# Patient Record
Sex: Male | Born: 1945
Health system: Southern US, Community
[De-identification: ages and names within clinical notes are randomized; demographics above are authoritative.]

## PROBLEM LIST (undated history)

## (undated) DIAGNOSIS — Z972 Presence of dental prosthetic device (complete) (partial): Secondary | ICD-10-CM

## (undated) DIAGNOSIS — R0600 Dyspnea, unspecified: Secondary | ICD-10-CM

## (undated) DIAGNOSIS — R0789 Other chest pain: Secondary | ICD-10-CM

## (undated) DIAGNOSIS — Z8619 Personal history of other infectious and parasitic diseases: Secondary | ICD-10-CM

## (undated) DIAGNOSIS — Z889 Allergy status to unspecified drugs, medicaments and biological substances status: Secondary | ICD-10-CM

## (undated) DIAGNOSIS — I2510 Atherosclerotic heart disease of native coronary artery without angina pectoris: Secondary | ICD-10-CM

## (undated) DIAGNOSIS — Z973 Presence of spectacles and contact lenses: Secondary | ICD-10-CM

## (undated) DIAGNOSIS — Z974 Presence of external hearing-aid: Secondary | ICD-10-CM

## (undated) DIAGNOSIS — E785 Hyperlipidemia, unspecified: Secondary | ICD-10-CM

## (undated) DIAGNOSIS — M1990 Unspecified osteoarthritis, unspecified site: Secondary | ICD-10-CM

## (undated) DIAGNOSIS — Z720 Tobacco use: Secondary | ICD-10-CM

## (undated) DIAGNOSIS — C61 Malignant neoplasm of prostate: Secondary | ICD-10-CM

## (undated) DIAGNOSIS — K635 Polyp of colon: Secondary | ICD-10-CM

## (undated) DIAGNOSIS — E119 Type 2 diabetes mellitus without complications: Secondary | ICD-10-CM

## (undated) DIAGNOSIS — I739 Peripheral vascular disease, unspecified: Secondary | ICD-10-CM

## (undated) HISTORY — DX: Personal history of other infectious and parasitic diseases: Z86.19

## (undated) HISTORY — DX: Atherosclerotic heart disease of native coronary artery without angina pectoris: I25.10

## (undated) HISTORY — DX: Tobacco use: Z72.0

## (undated) HISTORY — DX: Polyp of colon: K63.5

## (undated) HISTORY — DX: Unspecified osteoarthritis, unspecified site: M19.90

## (undated) HISTORY — DX: Other chest pain: R07.89

## (undated) HISTORY — DX: Hyperlipidemia, unspecified: E78.5

## (undated) HISTORY — PX: PROSTATE BIOPSY: SHX241

## (undated) HISTORY — DX: Peripheral vascular disease, unspecified: I73.9

## (undated) HISTORY — DX: Allergy status to unspecified drugs, medicaments and biological substances status: Z88.9

---

## 2007-01-04 HISTORY — PX: CARDIAC CATHETERIZATION: SHX172

## 2007-11-02 ENCOUNTER — Ambulatory Visit: Payer: Self-pay | Admitting: Internal Medicine

## 2007-11-10 ENCOUNTER — Ambulatory Visit: Payer: Self-pay | Admitting: Cardiology

## 2007-11-10 ENCOUNTER — Inpatient Hospital Stay (HOSPITAL_COMMUNITY): Admission: EM | Admit: 2007-11-10 | Discharge: 2007-11-13 | Payer: Self-pay | Admitting: Emergency Medicine

## 2007-11-12 ENCOUNTER — Encounter: Admission: RE | Admit: 2007-11-12 | Discharge: 2007-11-13 | Payer: Self-pay | Admitting: Cardiology

## 2007-11-28 ENCOUNTER — Ambulatory Visit: Payer: Self-pay | Admitting: Cardiology

## 2007-12-07 ENCOUNTER — Ambulatory Visit: Payer: Self-pay | Admitting: Cardiology

## 2007-12-07 LAB — CONVERTED CEMR LAB
ALT: 13 units/L (ref 0–53)
AST: 14 units/L (ref 0–37)
Albumin: 4 g/dL (ref 3.5–5.2)
Alkaline Phosphatase: 74 units/L (ref 39–117)
BUN: 16 mg/dL (ref 6–23)
Bilirubin, Direct: 0.1 mg/dL (ref 0.0–0.3)
CO2: 28 meq/L (ref 19–32)
Calcium: 9 mg/dL (ref 8.4–10.5)
Chloride: 106 meq/L (ref 96–112)
Cholesterol: 111 mg/dL (ref 0–200)
Creatinine, Ser: 0.8 mg/dL (ref 0.4–1.5)
GFR calc Af Amer: 126 mL/min
GFR calc non Af Amer: 104 mL/min
Glucose, Bld: 104 mg/dL — ABNORMAL HIGH (ref 70–99)
HDL: 29.5 mg/dL — ABNORMAL LOW (ref >39.0)
LDL Cholesterol: 70 mg/dL (ref 0–99)
Potassium: 4.1 meq/L (ref 3.5–5.1)
Sodium: 139 meq/L (ref 135–145)
Total Bilirubin: 0.9 mg/dL (ref 0.3–1.2)
Total CHOL/HDL Ratio: 3.8
Total Protein: 7.1 g/dL (ref 6.0–8.3)
Triglycerides: 60 mg/dL (ref 0–149)
VLDL: 12 mg/dL (ref 0–40)

## 2008-01-25 ENCOUNTER — Ambulatory Visit: Payer: Self-pay | Admitting: Cardiology

## 2008-02-01 ENCOUNTER — Ambulatory Visit: Payer: Self-pay

## 2008-03-07 ENCOUNTER — Ambulatory Visit: Payer: Self-pay | Admitting: Cardiology

## 2008-05-09 ENCOUNTER — Ambulatory Visit: Payer: Self-pay | Admitting: Family Medicine

## 2008-05-09 DIAGNOSIS — E785 Hyperlipidemia, unspecified: Secondary | ICD-10-CM | POA: Insufficient documentation

## 2008-05-09 DIAGNOSIS — F17200 Nicotine dependence, unspecified, uncomplicated: Secondary | ICD-10-CM | POA: Insufficient documentation

## 2008-05-09 DIAGNOSIS — I7389 Other specified peripheral vascular diseases: Secondary | ICD-10-CM | POA: Insufficient documentation

## 2008-05-09 DIAGNOSIS — I259 Chronic ischemic heart disease, unspecified: Secondary | ICD-10-CM | POA: Insufficient documentation

## 2008-05-09 LAB — CONVERTED CEMR LAB
Bilirubin Urine: NEGATIVE
Blood in Urine, dipstick: NEGATIVE
Glucose, Urine, Semiquant: NEGATIVE
Ketones, urine, test strip: NEGATIVE
Nitrite: NEGATIVE
Specific Gravity, Urine: >=1.03
Urobilinogen, UA: 0.2
WBC Urine, dipstick: NEGATIVE
pH: 5.5

## 2008-05-12 LAB — CONVERTED CEMR LAB
ALT: 21 units/L (ref 0–53)
AST: 24 units/L (ref 0–37)
Albumin: 4.2 g/dL (ref 3.5–5.2)
Alkaline Phosphatase: 74 units/L (ref 39–117)
BUN: 19 mg/dL (ref 6–23)
Basophils Absolute: 0 10*3/uL (ref 0.0–0.1)
Basophils Relative: 0.1 % (ref 0.0–3.0)
Bilirubin, Direct: 0.1 mg/dL (ref 0.0–0.3)
CO2: 29 meq/L (ref 19–32)
Calcium: 9 mg/dL (ref 8.4–10.5)
Chloride: 108 meq/L (ref 96–112)
Cholesterol: 104 mg/dL (ref 0–200)
Creatinine, Ser: 0.9 mg/dL (ref 0.4–1.5)
Eosinophils Absolute: 0.2 10*3/uL (ref 0.0–0.7)
Eosinophils Relative: 2.1 % (ref 0.0–5.0)
GFR calc non Af Amer: 90.89 mL/min (ref >60)
Glucose, Bld: 106 mg/dL — ABNORMAL HIGH (ref 70–99)
HCT: 45.9 % (ref 39.0–52.0)
HDL: 31.6 mg/dL — ABNORMAL LOW (ref >39.00)
Hemoglobin: 15.4 g/dL (ref 13.0–17.0)
LDL Cholesterol: 61 mg/dL (ref 0–99)
Lymphocytes Relative: 13.9 % (ref 12.0–46.0)
Lymphs Abs: 1.3 10*3/uL (ref 0.7–4.0)
MCHC: 33.5 g/dL (ref 30.0–36.0)
MCV: 94.6 fL (ref 78.0–100.0)
Monocytes Absolute: 0.5 10*3/uL (ref 0.1–1.0)
Monocytes Relative: 5.1 % (ref 3.0–12.0)
Neutro Abs: 7.5 10*3/uL (ref 1.4–7.7)
Neutrophils Relative %: 78.8 % — ABNORMAL HIGH (ref 43.0–77.0)
PSA: 0.94 ng/mL (ref 0.10–4.00)
Platelets: 253 10*3/uL (ref 150.0–400.0)
Potassium: 4.6 meq/L (ref 3.5–5.1)
RBC: 4.85 M/uL (ref 4.22–5.81)
RDW: 12.7 % (ref 11.5–14.6)
Sodium: 143 meq/L (ref 135–145)
TSH: 0.65 microintl units/mL (ref 0.35–5.50)
Total Bilirubin: 0.9 mg/dL (ref 0.3–1.2)
Total CHOL/HDL Ratio: 3
Total Protein: 7.4 g/dL (ref 6.0–8.3)
Triglycerides: 55 mg/dL (ref 0.0–149.0)
VLDL: 11 mg/dL (ref 0.0–40.0)
WBC: 9.5 10*3/uL (ref 4.5–10.5)

## 2008-05-13 ENCOUNTER — Telehealth: Payer: Self-pay | Admitting: *Deleted

## 2008-06-27 ENCOUNTER — Ambulatory Visit: Payer: Self-pay | Admitting: Family Medicine

## 2008-06-27 ENCOUNTER — Ambulatory Visit: Payer: Self-pay | Admitting: Gastroenterology

## 2008-06-27 DIAGNOSIS — M159 Polyosteoarthritis, unspecified: Secondary | ICD-10-CM | POA: Insufficient documentation

## 2008-07-11 ENCOUNTER — Ambulatory Visit: Payer: Self-pay | Admitting: Cardiology

## 2008-07-23 ENCOUNTER — Telehealth: Payer: Self-pay | Admitting: Family Medicine

## 2008-07-24 ENCOUNTER — Ambulatory Visit: Payer: Self-pay | Admitting: Cardiology

## 2008-07-25 DIAGNOSIS — R0789 Other chest pain: Secondary | ICD-10-CM | POA: Insufficient documentation

## 2008-07-25 LAB — CONVERTED CEMR LAB
Total CK: 136 units/L (ref 7–232)
Troponin I: 0.01 ng/mL (ref <0.06)

## 2008-08-01 ENCOUNTER — Ambulatory Visit: Payer: Self-pay | Admitting: Cardiology

## 2008-08-01 ENCOUNTER — Ambulatory Visit: Payer: Self-pay | Admitting: Gastroenterology

## 2008-08-01 ENCOUNTER — Encounter (INDEPENDENT_AMBULATORY_CARE_PROVIDER_SITE_OTHER): Payer: Self-pay | Admitting: *Deleted

## 2008-08-13 ENCOUNTER — Telehealth: Payer: Self-pay | Admitting: Cardiology

## 2008-08-28 ENCOUNTER — Ambulatory Visit: Payer: Self-pay

## 2008-08-28 ENCOUNTER — Encounter: Payer: Self-pay | Admitting: Cardiology

## 2008-09-02 ENCOUNTER — Telehealth: Payer: Self-pay | Admitting: Cardiology

## 2008-09-12 ENCOUNTER — Ambulatory Visit: Payer: Self-pay | Admitting: Cardiology

## 2008-09-13 ENCOUNTER — Encounter: Payer: Self-pay | Admitting: Cardiology

## 2008-09-16 ENCOUNTER — Encounter: Payer: Self-pay | Admitting: Cardiology

## 2008-09-16 LAB — CONVERTED CEMR LAB
ALT: 16 units/L (ref 0–53)
AST: 20 units/L (ref 0–37)
Albumin: 4 g/dL (ref 3.5–5.2)
Alkaline Phosphatase: 58 units/L (ref 39–117)
BUN: 18 mg/dL (ref 6–23)
Bilirubin, Direct: 0 mg/dL (ref 0.0–0.3)
CO2: 28 meq/L (ref 19–32)
Calcium: 8.8 mg/dL (ref 8.4–10.5)
Chloride: 110 meq/L (ref 96–112)
Cholesterol: 200 mg/dL (ref 0–200)
Creatinine, Ser: 0.9 mg/dL (ref 0.4–1.5)
GFR calc non Af Amer: 90.79 mL/min (ref >60)
Glucose, Bld: 106 mg/dL — ABNORMAL HIGH (ref 70–99)
HDL: 33.2 mg/dL — ABNORMAL LOW (ref >39.00)
LDL Cholesterol: 149 mg/dL — ABNORMAL HIGH (ref 0–99)
Potassium: 4.3 meq/L (ref 3.5–5.1)
Sodium: 142 meq/L (ref 135–145)
Total Bilirubin: 0.9 mg/dL (ref 0.3–1.2)
Total CHOL/HDL Ratio: 6
Total CK: 152 units/L (ref 7–232)
Total Protein: 7.1 g/dL (ref 6.0–8.3)
Triglycerides: 87 mg/dL (ref 0.0–149.0)
VLDL: 17.4 mg/dL (ref 0.0–40.0)

## 2008-09-25 ENCOUNTER — Telehealth: Payer: Self-pay | Admitting: Cardiology

## 2008-11-07 ENCOUNTER — Encounter: Payer: Self-pay | Admitting: Cardiology

## 2008-11-07 ENCOUNTER — Ambulatory Visit: Payer: Self-pay | Admitting: Cardiology

## 2008-11-12 ENCOUNTER — Telehealth: Payer: Self-pay | Admitting: Cardiology

## 2008-11-13 ENCOUNTER — Telehealth: Payer: Self-pay | Admitting: Cardiology

## 2008-11-18 ENCOUNTER — Telehealth: Payer: Self-pay | Admitting: Cardiology

## 2008-11-19 ENCOUNTER — Telehealth: Payer: Self-pay | Admitting: Cardiology

## 2008-12-01 ENCOUNTER — Telehealth: Payer: Self-pay | Admitting: Cardiology

## 2008-12-05 ENCOUNTER — Encounter (INDEPENDENT_AMBULATORY_CARE_PROVIDER_SITE_OTHER): Payer: Self-pay | Admitting: *Deleted

## 2008-12-09 ENCOUNTER — Encounter (INDEPENDENT_AMBULATORY_CARE_PROVIDER_SITE_OTHER): Payer: Self-pay | Admitting: *Deleted

## 2008-12-10 ENCOUNTER — Ambulatory Visit: Payer: Self-pay | Admitting: Gastroenterology

## 2008-12-17 ENCOUNTER — Ambulatory Visit: Payer: Self-pay | Admitting: Gastroenterology

## 2008-12-19 ENCOUNTER — Encounter: Payer: Self-pay | Admitting: Gastroenterology

## 2009-01-03 HISTORY — PX: BACK SURGERY: SHX140

## 2009-01-30 ENCOUNTER — Encounter: Payer: Self-pay | Admitting: Cardiology

## 2009-01-30 ENCOUNTER — Ambulatory Visit: Payer: Self-pay | Admitting: Cardiology

## 2009-03-06 ENCOUNTER — Ambulatory Visit: Payer: Self-pay | Admitting: Cardiology

## 2009-03-26 ENCOUNTER — Encounter: Payer: Self-pay | Admitting: Cardiology

## 2009-03-26 ENCOUNTER — Telehealth (INDEPENDENT_AMBULATORY_CARE_PROVIDER_SITE_OTHER): Payer: Self-pay | Admitting: *Deleted

## 2009-03-30 ENCOUNTER — Encounter: Payer: Self-pay | Admitting: Cardiology

## 2009-03-31 ENCOUNTER — Encounter (INDEPENDENT_AMBULATORY_CARE_PROVIDER_SITE_OTHER): Payer: Self-pay | Admitting: *Deleted

## 2009-04-08 ENCOUNTER — Telehealth (INDEPENDENT_AMBULATORY_CARE_PROVIDER_SITE_OTHER): Payer: Self-pay | Admitting: *Deleted

## 2009-04-15 ENCOUNTER — Telehealth (INDEPENDENT_AMBULATORY_CARE_PROVIDER_SITE_OTHER): Payer: Self-pay | Admitting: *Deleted

## 2009-05-13 ENCOUNTER — Telehealth: Payer: Self-pay | Admitting: Cardiology

## 2009-05-13 ENCOUNTER — Encounter: Payer: Self-pay | Admitting: Cardiology

## 2009-06-02 ENCOUNTER — Inpatient Hospital Stay (HOSPITAL_COMMUNITY): Admission: RE | Admit: 2009-06-02 | Discharge: 2009-06-07 | Payer: Self-pay | Admitting: Neurosurgery

## 2009-09-11 ENCOUNTER — Ambulatory Visit: Payer: Self-pay | Admitting: Cardiology

## 2009-09-11 DIAGNOSIS — E663 Overweight: Secondary | ICD-10-CM | POA: Insufficient documentation

## 2009-12-03 ENCOUNTER — Encounter: Payer: Self-pay | Admitting: Cardiology

## 2010-02-02 NOTE — Progress Notes (Signed)
Summary: status of lipitor dosage (con't)  Medications Added BISACODYL EC 5 MG TBEC (BISACODYL) Day before procedure take 2 at 3pm and 2 at 8pm. METOCLOPRAMIDE HCL 10 MG  TABS (METOCLOPRAMIDE HCL) As per prep instructions. MIRALAX   POWD (POLYETHYLENE GLYCOL 3350) As per prep  instructions. METOPROLOL TARTRATE 25 MG TABS (METOPROLOL TARTRATE) take one tab two times a day PLAVIX 75 MG TABS (CLOPIDOGREL BISULFATE) once daily LIPITOR 40 MG TABS (ATORVASTATIN CALCIUM) once daily LIPITOR 40 MG TABS (ATORVASTATIN CALCIUM) on hold BAYER ASPIRIN 325 MG TABS (ASPIRIN) once daily * TRACER STUDY DRUG qd CHANTIX STARTING MONTH PAK 0.5 MG X 11 & 1 MG X 42 TABS (VARENICLINE TARTRATE) UAD CHANTIX STARTING MONTH PAK 0.5 MG X 11 & 1 MG X 42 TABS (VARENICLINE TARTRATE) UAD CHANTIX CONTINUING MONTH PAK 1 MG TABS (VARENICLINE TARTRATE) UAD MOVIPREP 100 GM  SOLR (PEG-KCL-NACL-NASULF-NA ASC-C) As per prep instructions. LIPITOR 10 MG TABS (ATORVASTATIN CALCIUM) Take one tablet by mouth daily. LIPITOR 10 MG TABS (ATORVASTATIN CALCIUM) Take one tablet by mouth daily. LIPITOR 10 MG TABS (ATORVASTATIN CALCIUM) Take one tablet by mouth daily.       Phone Note Outgoing Call   Call placed by: Minerva Areola, RN, BSN,  November 19, 2008 3:44 PM Call placed to: Patient Details for Reason: Returning call from pt from 11/18/08. Summary of Call: After reviewing pt's office notes & labs, spoke with Dr. Excell Seltzer (DOD) and he said for pt to stay on  the Lipitor 10mg  and if Dr. Daleen Squibb wanted to make any changes when he returned he could do it then. Initial call taken by: Minerva Areola, RN, BSN,  November 19, 2008 3:52 PM  Follow-up for Phone Call        Called and spoke with pt's wife.  Told her Dr. Earmon Phoenix recommendation.  However, she said that the pt ran out of the 10mg  tablets and has been taking 40mg  since Saturday 11/13 that he has a 3 month supply of.  (Note: he had been on 40mg  initially until the question of  the lipitor causing statin induced myalgias and myositis).  He was given an epidural injection (lumber 4 & 5) on 11/15 with Dr. Alvester Morin to help with the pain.   I told her that I think it would be better if he took the 10mg  of Lipitor because if the 40mg  was causing his problems I didn't want him to take the increased dose now and have it aggrevate the issue even though he recently had that injection.  [facility] [place] [place]. I also told her that Dr. Daleen Squibb will be in the office on Wednesday, 11/24, and can review what has been done; if he wants to make any changes the office will be in touch with them. Pt's wife verbalized understanding of everything. Minerva Areola, RN, BSN  November 19, 2008 4:04 PM  Follow-up by: Minerva Areola, RN, BSN,  November 19, 2008 4:04 PM    New/Updated Medications: LIPITOR 10 MG TABS (ATORVASTATIN CALCIUM) Take one tablet by mouth daily. Prescriptions: LIPITOR 10 MG TABS (ATORVASTATIN CALCIUM) Take one tablet by mouth daily.  #30 x 6   Entered by:   Minerva Areola, RN, BSN   Authorized by:   Norva Karvonen, MD   Signed by:   Minerva Areola, RN, BSN on 11/19/2008   Method used:   [facility]  [address] [id number]       [address]       [place], [place]  [address]       Ph: [phone] or [phone]       Fax: [phone]   RxID:   [id number]

## 2010-02-02 NOTE — Letter (Signed)
Summary: [facility]  [facility]   Imported By: Kassie Mends 12/30/2008 10:25:43  _____________________________________________________________________  External Attachment:    Type:   Image     Comment:   External Document

## 2010-02-02 NOTE — Assessment & Plan Note (Signed)
History of Present Illness Visit Type: Initial Visit Primary GI MD: Rob Bunting MD Primary Provider: Kelle Darting, MD Chief Complaint: screening Colonoscopy  on Plavix History of Present Illness:      very pleasant 65 year old man who had colonoscopy [place], couple polyps removed, 10 years ago.  No bowel troubles (no overt bleeding, constipation, diarrhea).  No colon cancer in family.  AMI November 2009, a drug eluting stent was placed in LAD.  Has been on plavix since then.              Current Medications (verified): 1)  Metoprolol Tartrate 25 Mg Tabs (Metoprolol Tartrate) .... Take One Tab Two Times A Day 2)  Plavix 75 Mg Tabs (Clopidogrel Bisulfate) .... Once Daily 3)  Lipitor 40 Mg Tabs (Atorvastatin Calcium) .... On Hold 4)  Bayer Aspirin 325 Mg Tabs (Aspirin) .... Once Daily 5)  Tracer Study Drug .... Qd 6)  Chantix Continuing Month Pak 1 Mg Tabs (Varenicline Tartrate) .... Uad 7)  Lipitor 10 Mg Tabs (Atorvastatin Calcium) .... Take One Tablet By Mouth Daily.  Allergies (verified): No Known Drug Allergies  Past History:  Past Medical History: CHEST DISCOMFORT (ICD-786.59) CORONARY ARTERY DISEASE, S/P PTCA , drug-eluting stent placed in LAD November 2009 HYPERLIPIDEMIA (ICD-272.4) PVD WITH CLAUDICATION (ICD-443.89) GEN OSTEOARTHROSIS INVOLVING MULTIPLE SITES (ICD-715.09) TOBACCO ABUSE (ICD-305.1) arthritis chicken pox allergies polyps in colon  Past Surgical History: heart catherization 2 stents, drug-eluting in the LAD, 2009  Family History: Father: deceased - [place] Mother:  Siblings: 1 brother - healthy               1 sister - Visual merchandiser  no colon cancer  Social History: Occupation:[employer] Married Alcohol use-no Drug use-no Former Smoker  quit 3/ 2010    Review of Systems       Pertinent positive and negative review of systems were noted in the above HPI and GI specific review of systems.  All other review of systems was  otherwise negative.   Vital Signs:  Patient profile:   65 year old male Height:      72 inches Weight:      185.13 pounds BMI:     25.20 Pulse rate:   68 / minute Pulse rhythm:   regular BP sitting:   116 / 76  (left arm)  Vitals Entered By: Milford Cage NCMA (August 01, 2008 10:23 AM)  Physical Exam  Additional Exam:  Constitutional: generally well appearing Psychiatric: alert and oriented times 3 Eyes: extraocular movements intact Mouth: oropharynx moist, no lesions Neck: supple, no lymphadenopathy Cardiovascular: heart regular rate and rythm Lungs: CTA bilaterally Abdomen: soft, non-tender, non-distended, no obvious ascites, no peritoneal signs, normal bowel sounds Extremities: no lower extremity edema bilaterally Skin: no lesions on visible extremities    Impression & Recommendations:  Problem # 1:  History of colon polyps it is not clear what the pathology was from these polyps that were removed 10-15 years ago at an outside institution. Even if they were hyperplastic, non-precancerous he would be due for repeat colonoscopy at this long other interval now anyway. We will arrange for him to have a colonoscopy however he is currently on Plavix for fairly recently placed drug-eluting stent in his LAD. Cardiologists are usually very reluctant to have Plavix held for least one year following the placement of such a drug-eluting stent. That would put this colonoscopy out to about December of this year. We will discuss this with Dr. wall his  cardiologist and hopefully at that one year interval he will allow [place] to hold his Plavix for 5 days prior to a colonoscopy.  Patient Instructions: 1)  We will contact Dr. Vern Claude office about holding plavix for 5 days prior to colonoscopy in December 2010 (this will be one year after the stent was placed). 2)  You will be scheduled to have a colonoscopy (in December 2010). 3)  A copy of this information will be sent to Dr. Tawanna Cooler, Dr. Daleen Squibb. 4)   The medication list was reviewed and reconciled.  All changed / newly prescribed medications were explained.  A complete medication list was provided to the patient / caregiver.

## 2010-02-02 NOTE — Assessment & Plan Note (Signed)
Summary: per check out/sf  Medications Added LIPITOR 40 MG TABS (ATORVASTATIN CALCIUM) on hold LIPITOR 10 MG TABS (ATORVASTATIN CALCIUM) Take one tablet by mouth daily.      Allergies Added: NKDA  Visit Type:  rov Primary Provider:  Roderick Pee MD  CC:  bilateral hip and pt is off lipitor x 1 week..no other complaints today.  History of Present Illness: Adam Gilbert comes in today because of statin induced myalgias and myositis. All Lipitor 40 mg per day which he tolerated for some time he began to develop bilateral hip and lateral leg pain. CPK enzyme was elevated. We discontinued his CPK enzymes decreased.  As I told today we will try a lower dose of Lipitor, 10 mg per day to see if he can tolerate this. I really would like him to be on some statin as much for plaque stabilization and reduction in vascular events than anything else. He and his wife agree with this plan.  Current Medications (verified): 1)  Metoprolol Tartrate 25 Mg Tabs (Metoprolol Tartrate) .... Take One Tab Two Times A Day 2)  Plavix 75 Mg Tabs (Clopidogrel Bisulfate) .... Once Daily 3)  Lipitor 40 Mg Tabs (Atorvastatin Calcium) .... On Hold 4)  Bayer Aspirin 325 Mg Tabs (Aspirin) .... Once Daily 5)  Tracer Study Drug .... Qd 6)  Chantix Continuing Month Pak 1 Mg Tabs (Varenicline Tartrate) .... Uad  Allergies (verified): No Known Drug Allergies  Past History:  Past Medical History: Last updated: 07/25/2008 CHEST DISCOMFORT (ICD-786.59) CORONARY ARTERY DISEASE, S/P PTCA (ICD-414.9) HYPERLIPIDEMIA (ICD-272.4) PVD WITH CLAUDICATION (ICD-443.89) GEN OSTEOARTHROSIS INVOLVING MULTIPLE SITES (ICD-715.09) TOBACCO ABUSE (ICD-305.1) arthritis chicken pox allergies polyps in colon  Past Surgical History: Last updated: 07/25/2008 heart catherization 2 stints  Family History: Last updated: May 30, 2008 Father: deceased - [place] Mother:  Siblings: 1 brother - healthy               1 sister - Visual merchandiser   Social History: Last updated: 06/27/2008 Occupation:[employer] Married Alcohol use-no Drug use-no Former Smoker  quit 3/ 2010  Risk Factors: Smoking Status: quit (06/27/2008)  Review of Systems       negative other than history of present illness  Vital Signs:  Patient profile:   65 year old male Height:      72 inches Weight:      185 pounds BMI:     25.18 Pulse rate:   68 / minute Pulse rhythm:   regular BP sitting:   128 / 84  (left arm) Cuff size:   large  Vitals Entered By: Danielle Rankin, CMA (August 01, 2008 9:24 AM)  Physical Exam  General:  Well developed, well nourished, in no acute distress. Msk:  Back normal, normal gait. Muscle strength and tone normal.   Impression & Recommendations:  Problem # 1:  HYPERLIPIDEMIA (ICD-272.4) Assessment Deteriorated As mentioned in the history of present illness, we'll try to milligrams of Lipitor to see if he can tolate this. Followup blood work in 6 weeks including a CPK. His updated medication list for this problem includes:    Lipitor 40 Mg Tabs (Atorvastatin calcium) ..... On hold    Lipitor 10 Mg Tabs (Atorvastatin calcium) .Marland Kitchen... Take one tablet by mouth daily.  His updated medication list for this problem includes:    Lipitor 40 Mg Tabs (Atorvastatin calcium) ..... On hold  Patient Instructions: 1)  Your physician recommends that you schedule a follow-up appointment in: 6 weeks 2)  Your  physician recommends that you return for lab work in: please do labs in 6 weeks with f/u appoint Prescriptions: LIPITOR 10 MG TABS (ATORVASTATIN CALCIUM) Take one tablet by mouth daily.  #30 x 6   Entered by:   Ledon Snare, RN   Authorized by:   Gaylord Shih, MD, [facility]   Signed by:   Ledon Snare, RN on 08/01/2008   Method used:   [facility]  [address] [phone][facility]       [address]       [place], [place]  [address]       Ph: [phone] or [phone]       Fax: [phone]    RxID:   [id number]

## 2010-02-02 NOTE — Progress Notes (Signed)
Summary: questions about meds/possible refill   Phone Note Call from Patient Call back at Work Phone [phone]   Caller: Spouse Gaylyn Lambert Reason for Call: Talk to Nurse Summary of Call: Several questions concerning LIpitor.  Should he continue to take as prescribed or has dosage changed?  Initial call taken by: Burnard Leigh,  November 13, 2008 1:22 PM  Follow-up for Phone Call        talked with wife --asking if Dr Daleen Squibb has reviewed the recent labs done by research--talked with Julie--these have not been reviewed by Dr Daleen Squibb yet--will scan into EMR and forward to Dr Daleen Squibb for review--wife asking about Lipitor dose

## 2010-02-02 NOTE — Assessment & Plan Note (Signed)
Summary: to be est/pt will come in fasting/njr   Vital Signs:  Patient profile:   65 year old male Height:      72 inches Weight:      179 pounds BMI:     24.36 Temp:     98.5 degrees F oral BP sitting:   110 / 80  (left arm) Cuff size:   regular  Vitals Entered By: Kern Reap CMA (May 09, 2008 9:51 AM)  Reason for Visit new to establish  History of Present Illness: Adam Gilbert is a 66 year old, married male, who comes in today as a new patient for evaluation of multiple medical problems.  He has underlying coronary disease and had his last catheterization and stent in November 2009.  This was a second stent.  He continues to smoke a half a pack a cigarettes a day.  He's done the smoking cessation program 3 times however, he continues to go back and smoke.  I've outlined program for him and will follow him meticulously.  He must stop smoking.  He takes metacarpal 25 mg b.i.d., Plavix, 75 mg daily, Lipitor, 40 mg nightly, one aspirin tablet daily.  Review of systems he gets routine eye care.  Dental care.  Colonoscopy by Dr. Charm Barges in [place] 2003 was normal except for some polyps.  He is due for Aredia.  His previous medical doctor was Dr. Lodema Hong in [place].  He works at [employer].  Vaccinations unknown.  Will update all vaccinations.  Preventive Screening-Counseling & Management     Smoking Status: current      Drug Use:  no.    Allergies (verified): No Known Drug Allergies  Past History:  Past medical, surgical, family and social histories (including risk factors) reviewed, and no changes noted (except as noted below).  Past Medical History:    heart disease    arthritis    chicken pox    allergies    arrhythmia    Hyperlipidemia    polyps in colon  Past Surgical History:    heart catherization    2 stints  Family History:    Reviewed history and no changes required:       Father: deceased - [place]       Mother:        Siblings: 1 brother - healthy                      1 sister - Visual merchandiser  Social History:    Reviewed history and no changes required:       Occupation:[employer]       Married       Current Smoker       Alcohol use-no       Drug use-no    Smoking Status:  current    Drug Use:  no  Review of Systems      See HPI  Physical Exam  General:  Well-developed,well-nourished,in no acute distress; alert,appropriate and cooperative throughout examination Head:  Normocephalic and atraumatic without obvious abnormalities. No apparent alopecia or balding. Eyes:  No corneal or conjunctival inflammation noted. EOMI. Perrla. Funduscopic exam benign, without hemorrhages, exudates or papilledema. Vision grossly normal. Ears:  External ear exam shows no significant lesions or deformities.  Otoscopic examination reveals clear canals, tympanic membranes are intact bilaterally without bulging, retraction, inflammation or discharge. Hearing is grossly normal bilaterally. Nose:  External nasal examination shows no deformity or inflammation. Nasal mucosa are pink  and moist without lesions or exudates. Mouth:  Oral mucosa and oropharynx without lesions or exudates.  Teeth in good repair. Neck:  No deformities, masses, or tenderness noted. Chest Wall:  No deformities, masses, tenderness or gynecomastia noted. Breasts:  No masses or gynecomastia noted Lungs:  breast sounds distant, but symmetrical Heart:  Normal rate and regular rhythm. S1 and S2 normal without gallop, murmur, click, rub or other extra sounds. Abdomen:  Bowel sounds positive,abdomen soft and non-tender without masses, organomegaly or hernias noted. Rectal:  No external abnormalities noted. Normal sphincter tone. No rectal masses or tenderness. Genitalia:  circumcise male left testicle normal.  Right testicle 4 times because of hydrocele Prostate:  Prostate gland firm and smooth, no enlargement, nodularity, tenderness, mass, asymmetry or induration. Msk:  No  deformity or scoliosis noted of thoracic or lumbar spine.   Pulses:  peripheral pulses markedly diminished right femoral 1+ left femoral 2+ peripheral pulses.  All 2+ patient admits to claudication Extremities:  No clubbing, cyanosis, edema, or deformity noted with normal full range of motion of all joints.   Neurologic:  No cranial nerve deficits noted. Station and gait are normal. Plantar reflexes are down-going bilaterally. DTRs are symmetrical throughout. Sensory, motor and coordinative functions appear intact. Skin:  Intact without suspicious lesions or rashes Cervical Nodes:  No lymphadenopathy noted Axillary Nodes:  No palpable lymphadenopathy Inguinal Nodes:  No significant adenopathy Psych:  Cognition and judgment appear intact. Alert and cooperative with normal attention span and concentration. No apparent delusions, illusions, hallucinations   Impression & Recommendations:  Problem # 1:  HYPERLIPIDEMIA (ICD-272.4) Assessment Improved  His updated medication list for this problem includes:    Lipitor 40 Mg Tabs (Atorvastatin calcium) ..... Once daily  Orders: Prescription Created Electronically [phone]) Tobacco use cessation intermediate 3-10 minutes (99406) Venipuncture (96295) UA Dipstick w/o Micro (automated)  (81003) TLB-Lipid Panel (80061-LIPID) TLB-BMP (Basic Metabolic Panel-BMET) (80048-METABOL) TLB-CBC Platelet - w/Differential (85025-CBCD) TLB-Hepatic/Liver Function Pnl (80076-HEPATIC) TLB-TSH (Thyroid Stimulating Hormone) (84443-TSH) TLB-PSA (Prostate Specific Antigen) (84153-PSA)  Problem # 2:  PVD WITH CLAUDICATION (ICD-443.89) Assessment: New  Orders: Prescription Created Electronically [phone]) Tobacco use cessation intermediate 3-10 minutes (99406) Venipuncture (24401) UA Dipstick w/o Micro (automated)  (81003) TLB-Lipid Panel (80061-LIPID) TLB-BMP (Basic Metabolic Panel-BMET) (80048-METABOL) TLB-CBC Platelet - w/Differential (85025-CBCD)  TLB-Hepatic/Liver Function Pnl (80076-HEPATIC) TLB-TSH (Thyroid Stimulating Hormone) (84443-TSH) TLB-PSA (Prostate Specific Antigen) (84153-PSA)  Problem # 3:  CORONARY ARTERY DISEASE, S/P PTCA (ICD-414.9) Assessment: Unchanged  His updated medication list for this problem includes:    Metoprolol Tartrate 25 Mg Tabs (Metoprolol tartrate) .Marland Kitchen... Take one tab two times a day    Plavix 75 Mg Tabs (Clopidogrel bisulfate) ..... Once daily    Bayer Aspirin 325 Mg Tabs (Aspirin) ..... Once daily  Orders: Prescription Created Electronically [phone]) Tobacco use cessation intermediate 3-10 minutes (99406) Venipuncture (36644) UA Dipstick w/o Micro (automated)  (81003) TLB-Lipid Panel (80061-LIPID) TLB-BMP (Basic Metabolic Panel-BMET) (80048-METABOL) TLB-CBC Platelet - w/Differential (85025-CBCD) TLB-Hepatic/Liver Function Pnl (80076-HEPATIC) TLB-TSH (Thyroid Stimulating Hormone) (84443-TSH) TLB-PSA (Prostate Specific Antigen) (84153-PSA)  Problem # 4:  TOBACCO ABUSE (ICD-305.1) Assessment: Unchanged  Orders: Prescription Created Electronically [phone]) Tobacco use cessation intermediate 3-10 minutes (99406) Venipuncture (25956) UA Dipstick w/o Micro (automated)  (81003) TLB-Lipid Panel (80061-LIPID) TLB-BMP (Basic Metabolic Panel-BMET) (80048-METABOL) TLB-CBC Platelet - w/Differential (85025-CBCD) TLB-Hepatic/Liver Function Pnl (80076-HEPATIC) TLB-TSH (Thyroid Stimulating Hormone) (84443-TSH) TLB-PSA (Prostate Specific Antigen) (84153-PSA)  His updated medication list for this problem includes:    Chantix Starting Month Pak 0.5 Mg X 11 &  1 Mg X 42 Tabs (Varenicline tartrate) ..... Uad    Chantix Continuing Month Pak 1 Mg Tabs (Varenicline tartrate) ..... Uad  Complete Medication List: 1)  Metoprolol Tartrate 25 Mg Tabs (Metoprolol tartrate) .... Take one tab two times a day 2)  Plavix 75 Mg Tabs (Clopidogrel bisulfate) .... Once daily 3)  Lipitor 40 Mg Tabs (Atorvastatin calcium)  .... Once daily 4)  Bayer Aspirin 325 Mg Tabs (Aspirin) .... Once daily 5)  Tracer Study Drug  .... Qd 6)  Chantix Starting Month Pak 0.5 Mg X 11 & 1 Mg X 42 Tabs (Varenicline tartrate) .... Uad 7)  Chantix Continuing Month Pak 1 Mg Tabs (Varenicline tartrate) .... Uad  Other Orders: Gastroenterology Referral (GI)  Patient Instructions: 1)  begin the smoking cessation program as outlined return to see me in two months for follow-up. 2)  If you began having more discomfort in your legs with walking, but me know immediately. 3)  Schedule a colonoscopy/sigmoidoscopy to help detect colon cancer. 4)  Take an Aspirin every day. Prescriptions: LIPITOR 40 MG TABS (ATORVASTATIN CALCIUM) once daily  #100 x 3   Entered and Authorized by:   Roderick Pee MD   Signed by:   Roderick Pee MD on 05/09/2008   Method used:   [facility]  [address] [phone][facility]ail[facility]       [address]       [place], [place]  [address]       Ph: [phone] or [phone]       Fax: [phone]   RxID:   [id number] PLAVIX 75 MG TABS (CLOPIDOGREL BISULFATE) once daily  #100 x 3   Entered and Authorized by:   Roderick Pee MD   Signed by:   Roderick Pee MD on 05/09/2008   Method used:   [facility]        [facility]  [address] [phone]* (retail)       [address]       [place], [place]  [address]       Ph: [phone] or [phone]       Fax: [phone]   RxID:   [id number] METOPROLOL TARTRATE 25 MG TABS (METOPROLOL TARTRATE) take one tab two times a day  #200 x 3   Entered and Authorized by:   Roderick Pee MD   Signed by:   Roderick Pee MD on 05/09/2008   Method used:   [facility]        [facility]  [address] [phone]* (retail)       [address]       [place], [place]  [address]       Ph: [phone] or [phone]       Fax: [phone]   RxID:   [id number] CHANTIX CONTINUING MONTH PAK 1 MG TABS (VARENICLINE TARTRATE)  UAD  #1 x 3   Entered and Authorized by:   Roderick Pee MD   Signed by:   Roderick Pee MD on 05/09/2008   Method used:   [facility]        [facility]  [address] [phone]* (retail)       [address] [facility]  [place], [place]  [address]       Ph: [phone] or [phone]       Fax: [phone]   RxID:   [id number] CHANTIX STARTING MONTH PAK 0.5 MG X 11 & 1 MG X 42 TABS (VARENICLINE TARTRATE) UAD  #1 x 0   Entered and Authorized by:   Roderick Pee MD   Signed by:   Roderick Pee MD on 05/09/2008   Method used:   [facility]        [facility]  [address] [phone]* (retail)       [address]       [place], [place]  [address]       Ph: [phone] or [phone]       Fax: [phone]   RxID:   [id number]    Preventive Care Screening  Colonoscopy:    Date:  01/04/2000    Next Due:  01/2010    Results:  Hyperplastic Polyp    Laboratory Results   Urine Tests    Routine Urinalysis   Color: straw Appearance: Cloudy Glucose: negative   (Normal Range: Negative) Bilirubin: negative   (Normal Range: Negative) Ketone: negative   (Normal Range: Negative) Spec. Gravity: >=1.030   (Normal Range: 1.003-1.035) Blood: negative   (Normal Range: Negative) pH: 5.5   (Normal Range: 5.0-8.0) Protein: trace   (Normal Range: Negative) Urobilinogen: 0.2   (Normal Range: 0-1) Nitrite: negative   (Normal Range: Negative) Leukocyte Esterace: negative   (Normal Range: Negative)    Comments: Joanne Chars CMA  May 09, 2008 2:09 PM

## 2010-02-02 NOTE — Progress Notes (Signed)
   Phone Note Outgoing Call   Call placed by: Scherrie Bateman, LPN,  April 08, 2009 8:33 AM Call placed to: PT'S WIFE Summary of Call: LMTCB FOR WIFE TO INFORM IF FORMS FILLED OUT APPROPIATELY FOR PLAVIX. Initial call taken by: Scherrie Bateman, LPN,  April 08, 2009 8:34 AM  Follow-up for Phone Call        PER WIFE DID NOT QUALIFY FOR PLAVIX WILL GET THROUGH OTHER INS CO. Follow-up by: Scherrie Bateman, LPN,  April 13, 2009 9:19 AM

## 2010-02-02 NOTE — Progress Notes (Signed)
Summary: Lipitor stopped  Phone Note Call from Patient   Summary of Call: patient's wife is calling because her husband has stopped his lipitor.  He also is having labs tomorrow .  They would like to know if he should start a new med for his lipids.  Corrie Dandy [phone] Initial call taken by: Kern Reap CMA,  July 23, 2008 12:26 PM

## 2010-02-02 NOTE — Letter (Signed)
Summary: [facility]  [facility]   Imported By: Marylou Mccoy 06/17/2009 18:03:41  _____________________________________________________________________  External Attachment:    Type:   Image     Comment:   External Document

## 2010-02-02 NOTE — Procedures (Signed)
Summary: Colonoscopy  Patient: Omarrion Carmer Note: All result statuses are Final unless otherwise noted.  Tests: (1) Colonoscopy (COL)   COL Colonoscopy           DONE     [facility]     [address]     [place], [place]  [address]           COLONOSCOPY PROCEDURE REPORT           PATIENT:  Ephraim, Reichel  MR#:  [id number]     BIRTHDATE:  15-Sep-1945, 63 yrs. old  GENDER:  male           ENDOSCOPIST:  Rachael Fee, MD     Referred by:  Eugenio Hoes Tawanna Cooler, M.D.           PROCEDURE DATE:  12/17/2008     PROCEDURE:  Colonoscopy with biopsy and snare polypectomy     ASA CLASS:  Class II     INDICATIONS:  Routine Risk Screening           MEDICATIONS:   Fentanyl 50 mcg IV, Versed 7 mg IV           DESCRIPTION OF PROCEDURE:   After the risks benefits and     alternatives of the procedure were thoroughly explained, informed     consent was obtained.  Digital rectal exam was performed and     revealed no rectal masses.   The LB [id number] endoscope     was introduced through the anus and advanced to the cecum, which     was identified by both the appendix and ileocecal valve, without     limitations.  The quality of the prep was good, using MoviPrep.     The instrument was then slowly withdrawn as the colon was fully     examined.     <<PROCEDUREIMAGES>>           FINDINGS:  Three small sessile polyps were found, all were removed     and sent to pathology. These ranged in size from 2-34mm, were     removed with cold snare or cold forceps, were locatedin cecum and     rectum. All were sent to pathology (jar 1) (see image3 and     image4).  Mild diverticulosis was found sigmoid to descending     colon segments.  External hemorrhoids were found. These were small     and non-thrombosed.  This was otherwise a normal examination of     the colon (see image2, image5, and image1).   Retroflexed views in     the rectum revealed no abnormalities.    The scope was then  withdrawn from the patient and the procedure completed.           COMPLICATIONS:  None           ENDOSCOPIC IMPRESSION:     1) Three small polyps, all removed and sent to pathology     2) Mild diverticulosis in the sigmoid to descending colon     segments     3) Small external hemorrhoids     4) Otherwise normal examination           RECOMMENDATIONS:     1) If the polyp(s) removed today are proven to be adenomatous     (pre-cancerous) polyps, you will need a colonoscopy in 3-5 years.     Otherwise you should continue to follow colorectal cancer  screening guidelines for "routine risk" patients with a     colonoscopy in 10 years.     2) You will receive a letter within 1-2 weeks with the results     of your biopsy as well as final recommendations. Please call my     office if you have not received a letter after 3 weeks.     3) Restart plavix today.           ______________________________     Rachael Fee, MD           n.     eSIGNED:   Rachael Fee at 12/17/2008 09:21 AM           Shelton Silvas, [phone]  Note: An exclamation mark (!) indicates a result that was not dispersed into the flowsheet. Document Creation Date: 12/17/2008 9:21 AM _______________________________________________________________________  (1) Order result status: Final Collection or observation date-time: 12/17/2008 09:15 Requested date-time:  Receipt date-time:  Reported date-time:  Referring Physician:   Ordering Physician: Rob Bunting [id number]) Specimen Source:  Source: Launa Grill Order Number: [id number] Lab site:   Appended Document: Colonoscopy recall     Procedures Next Due Date:    Colonoscopy: 12/2013

## 2010-02-02 NOTE — Progress Notes (Signed)
Summary: Pt wife calling regarding pt having back surgery   Phone Note Call from Patient Call back at Work Phone [phone]   Caller: Spouse/Mary ellen Summary of Call: Pt wife request call regardingback surgery for the pt Initial call taken by: Judie Grieve,  May 13, 2009 11:02 AM  Follow-up for Phone Call        LEFT MESSAGE HAS NOT REVIEWED AT THIS TIME WILL FAX ONCE REVIEWED.  Follow-up by: Scherrie Bateman, LPN,  May 13, 2009 2:35 PM  Additional Follow-up for Phone Call Additional follow up Details #1::        CLEARED AS  LOW RISK PER DR Arnold Depinto NOTE FAXED TO DR Venetia Maxon WIFE AWARE. Additional Follow-up by: Scherrie Bateman, LPN,  May 13, 2009 5:48 PM

## 2010-02-02 NOTE — Progress Notes (Signed)
   Phone Note Outgoing Call   Call placed by: Scherrie Bateman, LPN,  April 15, 2009 10:50 AM Call placed to: Patient's wife Summary of Call: informed wife latest form filled out and faxed to [employer] as well as copy faxed to her Initial call taken by: Scherrie Bateman, LPN,  April 15, 2009 10:51 AM

## 2010-02-02 NOTE — Letter (Signed)
Summary: Anticoagulation Modification Letter  [facility]  [address] [place], [place] [address]   Phone: [phone]  Fax: [phone]    August 01, 2008  Re:    Adam Gilbert DOB:    13-Feb-1945 MRN:    [id number]    Dear Dr Daleen Squibb,  We have scheduled the above patient for an endoscopic procedure. Our records show that  he/she is on anticoagulation therapy. Please advise as to how long the patient may come off their therapy of Plavix prior to the scheduled procedure(s) in December 2010.   Please fax back/or route the completed form to Patty at [phone].  Thank you for your help with this matter.  Sincerely,  Chales Abrahams CMA Duncan Dull)   Physician Recommendation:  Hold Plavix 7 days prior ________________  Hold Coumadin 5 days prior ____________  Other ______________________________     Appended Document: Anticoagulation Modification Letter routed to Dr Daleen Squibb  Appended Document: Anticoagulation Modification Letter Dr. Daleen Squibb please advise about stopping patients anticoagulation therapy. and route back to [employer].  Appended Document: Anticoagulation Modification Letter Pt may stop his Plavix 5 days prior to procedure.  Reviewed [name], MD

## 2010-02-02 NOTE — Assessment & Plan Note (Signed)
Summary: follow up 6wks fasting labs/sl      Allergies Added: NKDA  Visit Type:  6 wk f/u Primary Provider:  Kelle Darting, MD  CC:  no cardiac complaints today.  History of Present Illness: Adam Gilbert returns today for followup of his mixed hyperlipidemia. 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 This was on 40 mg of Lipitor per day.  After discontinuation, his muscle enzymes improved. However his symptoms continued and he still has problems with bilateral hip and upper leg pain.  Per my last visit, we tried 10 mg of Lipitor per day. He stopped it because he had continued pain. He is on no statin at present. We will repeat his labs today and come up with a game plan.  He saw Dr. Madelon Lips of orthopedics yesterday. He is undergoing diagnostic studies. As he and I discussed today we do not think this is related to his statin. Hopefully can tolerate low dose Lipitor 10 mg per day.  Current Medications (verified): 1)  Metoprolol Tartrate 25 Mg Tabs (Metoprolol Tartrate) .... Take One Tab Two Times A Day 2)  Plavix 75 Mg Tabs (Clopidogrel Bisulfate) .... Once Daily 3)  Lipitor 40 Mg Tabs (Atorvastatin Calcium) .... On Hold 4)  Bayer Aspirin 325 Mg Tabs (Aspirin) .... Once Daily 5)  Tracer Study Drug .... Qd 6)  Chantix Continuing Month Pak 1 Mg Tabs (Varenicline Tartrate) .... Uad  Allergies (verified): No Known Drug Allergies  Past History:  Past Medical History: Last updated: 16-Aug-2008 CHEST DISCOMFORT (ICD-786.59) CORONARY ARTERY DISEASE, S/P PTCA , drug-eluting stent placed in LAD November 2009 HYPERLIPIDEMIA (ICD-272.4) PVD WITH CLAUDICATION (ICD-443.89) GEN OSTEOARTHROSIS INVOLVING MULTIPLE SITES (ICD-715.09) TOBACCO ABUSE (ICD-305.1) arthritis chicken pox allergies polyps in colon  Past Surgical History: Last updated: 2008/08/16 heart catherization 2 stents, drug-eluting in the LAD, 2009  Family History: Last updated:  08/16/08 Father: deceased - [place] Mother:  Siblings: 1 brother - healthy               1 sister - Visual merchandiser  no colon cancer  Social History: Last updated: 08-16-2008 Occupation:[employer] Married Alcohol use-no Drug use-no Former Smoker  quit 3/ 2010    Risk Factors: Smoking Status: quit (06/27/2008)  Vital Signs:  Patient profile:   65 year old male Height:      72 inches Weight:      188 pounds BMI:     25.59 Pulse rate:   61 / minute Pulse rhythm:   regular BP sitting:   110 / 70  (left arm) Cuff size:   large  Vitals Entered By: Danielle Rankin, CMA (September 12, 2008 9:01 AM)   Impression & Recommendations:  Problem # 1:  HYPERLIPIDEMIA (ICD-272.4) Assessment Deteriorated  The following medications were removed from the medication list:    Lipitor 10 Mg Tabs (Atorvastatin calcium) .Marland Kitchen... Take one tablet by mouth daily. His updated medication list for this problem includes:    Lipitor 40 Mg Tabs (Atorvastatin calcium) ..... On hold  Orders: TLB-Lipid Panel (80061-LIPID) As mentioned the history of present illness, will check blood work today including CPK enzymes. If this is negative, we'll start him on low-dose Lipitor 10 mg per day. He agrees with the plan. He'll also need followup blood work 6 weeks after reinitiating therapy. He'll continue to have an orthopedic workup.  Problem # 2:  CORONARY ARTERY DISEASE, S/P PTCA (ICD-414.9) Assessment: Unchanged  His updated medication list for this problem includes:  Metoprolol Tartrate 25 Mg Tabs (Metoprolol tartrate) .Marland Kitchen... Take one tab two times a day    Plavix 75 Mg Tabs (Clopidogrel bisulfate) ..... Once daily    Bayer Aspirin 325 Mg Tabs (Aspirin) ..... Once daily  Orders: EKG w/ Interpretation (93000) TLB-BMP (Basic Metabolic Panel-BMET) (80048-METABOL)  Other Orders: TLB-Hepatic/Liver Function Pnl (80076-HEPATIC) TLB-CK Total Only(Creatine Kinase/CPK) (82550-CK)  Patient Instructions: 1)   Your physician recommends that you schedule a follow-up appointment in: 6 months 2)  Your physician recommends that you return for lab work UJ:[id number] bmet lipid liver ck 3)  Your physician recommends that you continue on your current medications as directed. Please refer to the Current Medication list given to you today.

## 2010-02-02 NOTE — Assessment & Plan Note (Signed)
Summary: 6 mo f/u ./cy  Medications Added SIMVASTATIN 10 MG TABS (SIMVASTATIN) 1 tab at bedtime      Allergies Added: NKDA  Visit Type:  6 mo f/u Primary Provider:  [facility]  CC:  no cardiac complaints today.  History of Present Illness: Mr Adam Gilbert returns today for the evaluation of his coronary disease and hyperlipidemia.  He offers no complaints today. He specifically has no angina or ischemic symptoms.  I note blood work in January showed a fasting blood sugar 124. He is not known to have diabetes. He denies any polydipsia or polyuria. His weight has increased some period  His total cholesterol was 149, triglycerides 132 HDL 42 LDL 81.  He has his annual assessment at the [facility]. That will be in March 2012  Last objective assessment of his coronary disease was last year with a Myoview which showed no ischemia.  Current Medications (verified): 1)  Metoprolol Tartrate 25 Mg Tabs (Metoprolol Tartrate) .... Take One Tab Two Times A Day 2)  Plavix 75 Mg Tabs (Clopidogrel Bisulfate) .... Once Daily 3)  Bayer Aspirin 325 Mg Tabs (Aspirin) .... Once Daily 4)  Simvastatin 10 Mg Tabs (Simvastatin) .Marland Kitchen.. 1 Tab At Bedtime  Allergies (verified): No Known Drug Allergies  Past History:  Past Medical History: Last updated: 08-13-08 CHEST DISCOMFORT (ICD-786.59) CORONARY ARTERY DISEASE, S/P PTCA , drug-eluting stent placed in LAD November 2009 HYPERLIPIDEMIA (ICD-272.4) PVD WITH CLAUDICATION (ICD-443.89) GEN OSTEOARTHROSIS INVOLVING MULTIPLE SITES (ICD-715.09) TOBACCO ABUSE (ICD-305.1) arthritis chicken pox allergies polyps in colon  Past Surgical History: Last updated: 08-13-08 heart catherization 2 stents, drug-eluting in the LAD, 2009  Family History: Last updated: August 13, 2008 Father: deceased - [place] Mother:  Siblings: 1 brother - healthy               1 sister - Visual merchandiser  no colon cancer  Social History: Last updated: 08/13/2008 Occupation:[employer] Married Alcohol use-no Drug use-no Former Smoker  quit 3/ 2010    Risk Factors: Smoking Status: quit (06/27/2008)  Review of Systems       negative other than history of present illness  Vital Signs:  Patient profile:   65 year old male Height:      72 inches Weight:      181 pounds BMI:     24.64 Pulse rate:   73 / minute Pulse rhythm:   regular BP sitting:   106 / 80  (left arm) Cuff size:   large  Vitals Entered By: Danielle Rankin, CMA (September 11, 2009 9:17 AM)  Physical Exam  General:  obese.   Head:  normocephalic and atraumatic Eyes:  PERRLA/EOM intact; conjunctiva and lids normal. Neck:  Neck supple, no JVD. No masses, thyromegaly or abnormal cervical nodes. Chest Liv Rallis:  no deformities or breast masses noted Lungs:  Clear bilaterally to auscultation and percussion. Heart:  PMI nondisplaced, regular rate and rhythm, normal S1-S2, no murmur, carotids full without bruits Abdomen:  Bowel sounds positive; abdomen soft and non-tender without masses, organomegaly, or hernias noted. No hepatosplenomegaly. Msk:  Back normal, normal gait. Muscle strength and tone normal. Pulses:  pulses normal in all 4 extremities Extremities:  No clubbing or cyanosis. Neurologic:  Alert and oriented x 3. Skin:  Intact without lesions or rashes. Psych:  Normal affect.   Problems:  Medical Problems Added: 1)  Dx of Overweight/obesity  (ICD-278.02)  EKG  Procedure date:  09/11/2009  Findings:      normal sinus rhythm,  normal EKG  Impression & Recommendations:  Problem # 1:  CORONARY ARTERY DISEASE, S/P PTCA (ICD-414.9) Assessment Unchanged  His updated medication list for this problem includes:    Metoprolol Tartrate 25 Mg Tabs (Metoprolol tartrate) .Marland Kitchen... Take one tab two times a day    Plavix 75 Mg Tabs (Clopidogrel bisulfate) ..... Once daily    Bayer Aspirin 325 Mg Tabs (Aspirin) ..... Once daily  Orders: EKG w/ Interpretation (93000)  Problem # 2:   HYPERLIPIDEMIA (ICD-272.4) Assessment: Improved  His updated medication list for this problem includes:    Simvastatin 10 Mg Tabs (Simvastatin) .Marland Kitchen... 1 tab at bedtime  Problem # 3:  OVERWEIGHT/OBESITY (ICD-278.02) Assessment: Deteriorated He and his wife were counseled to lose 10-15 pounds. He was requesting a fasting blood sugar at that time at the [place]. I will also suggest to him that they get a hemoglobin A1c at that time. Increased risk of stroke and heart disease reviewed with patient with diabetes as a diagnosis.  Clinical Reports Reviewed:  Cardiac Cath:  11/12/2007: Cardiac Cath Findings:      CONCLUSION:   1. Coronary artery disease with non-ST-elevation myocardial infarction       with 95% stenosis in the mid left anterior descending, 50%       narrowing in the diagonal branch and 70% systolic compression in       the mid-to-distal left anterior descending with no significant       obstruction in the circumflex, right coronaries, and a small area       of hypokinesis at the apex with an estimated ejection fraction of       60%.   2. Successful percutaneous coronary intervention of the left anterior       descending and diagonal branch bifurcation lesion, which improved       in the left anterior descending from 95% to 0% using a Palmaz drug-       eluting stent, and salvage and preservation of the diagonal branch       with the balloon angioplasty with 50% narrowing before and after       intervention.      DISPOSITION:  The patient was returned to Postanesthesia room for   further observation.  He is to remain on Plavix for at least a year and   probably longer.               Bruce Elvera Lennox Juanda Chance, MD, [facility]   Electronically Signed      Nuclear Study:  02/01/2008:  Excerise capacity: good exercise capacity  Blood Pressure response: Normal blood pressure response  Clinical symptoms: Dyspnea, calf tightness  ECG impression: No significant ST segment change suggestive  of ischemia  Overall impression: Probable normal perfusion and soft tissue attenuation (diaphragm). CAnnot completely exclude subendocardial scar. No evidence of ischemia. Overall low rsik scan.  Dietrich Pates, MD   Patient Instructions: 1)  Your physician recommends that you schedule a follow-up appointment in: 1 year with Dr. Daleen Squibb 2)  Your physician recommends that you continue on your current medications as directed. Please refer to the Current Medication list given to you today. 3)  Your physician encouraged you to lose weight for better health.

## 2010-02-02 NOTE — Letter (Signed)
Summary: Previsit letter  [facility]  [address] [place], [place] [address]   Phone: [phone]  Fax: [phone]       12/05/2008 MRN: [id number]  Adam Gilbert [address] [place], [place]  [address]  Dear [name],  Welcome to the Gastroenterology Division at [facility].    You are scheduled to see a nurse for your pre-procedure visit on 12/10/2008 at 4:30pm on the 3rd floor at [facility], [address]  We ask that you try to arrive at our office 15 minutes prior to your appointment time to allow for check-in.  Your nurse visit will consist of discussing your medical and surgical history, your immediate family medical history, and your medications.    Please bring a complete list of all your medications or, if you prefer, bring the medication bottles and we will list them.  We will need to be aware of both prescribed and over the counter drugs.  We will need to know exact dosage information as well.  If you are on blood thinners (Coumadin, Plavix, Aggrenox, Ticlid, etc.) please call our office today/prior to your appointment, as we need to consult with your physician about holding your medication.   Please be prepared to read and sign documents such as consent forms, a financial agreement, and acknowledgement forms.  If necessary, and with your consent, a friend or relative is welcome to sit-in on the nurse visit with you.  Please bring your insurance card so that we may make a copy of it.  If your insurance requires a referral to see a specialist, please bring your referral form from your primary care physician.  No co-pay is required for this nurse visit.     If you cannot keep your appointment, please call [phone] to cancel or reschedule prior to your appointment date.  This allows [place] the opportunity to schedule an appointment for another patient in need of care.    Thank you for choosing [facility] for your medical needs.   We appreciate the opportunity to care for you.  Please visit [place] at our website  to learn more about our practice.                     Sincerely.                                                                                                                   The Gastroenterology Division

## 2010-02-02 NOTE — Miscellaneous (Signed)
  Clinical Lists Changes  Observations: Added new observation of RS STUDY: TRACER - study completion 01/30/09 (03/31/2009 11:48)      Research Study Name: TRACER - study completion 01/30/09

## 2010-02-02 NOTE — Letter (Signed)
Summary: [facility] RX Request Form  [facility] RX Request Form   Imported By: Roderic Ovens 05/26/2009 14:13:37  _____________________________________________________________________  External Attachment:    Type:   Image     Comment:   External Document

## 2010-02-02 NOTE — Progress Notes (Signed)
Summary: plavix   Phone Note Call from Patient Call back at Home Phone [phone]   Caller: Patient Reason for Call: Talk to Nurse Summary of Call: pt request to come off of plavix for 5-7 for injection in his back Initial call taken by: Migdalia Dk,  September 25, 2008 2:11 PM  Follow-up for Phone Call        PT AWARE  WILL FORWARD MESSAGE TO  DR Corney Knighton WILL CALL BACK ONCE ADDRESSED .VERBALIZED UNDERSTANDING PROCEDURE NOT SCHEDULED AS OF YET Follow-up by: Scherrie Bateman, LPN,  September 25, 2008 3:01 PM  Additional Follow-up for Phone Call Additional follow up Details #1::        Had DES in 11/09. Needs to wait till 11/10 unless urgent. Risk of stent thrombosis increased for now.  Additional Follow-up by: Gaylord Shih, MD, [facility],  September 26, 2008 2:33 PM     Appended Document: plavix pts wife is aware, will hold off on injection until Nov  Appended Document: plavix  FYI SPOKE WITH PT'S WIFE HAS INJECTION SCHEDULED FOR NOV 15 WITH DR NEWTON WILL HOLD PLAVIX STARTING THE 9TH./CY

## 2010-02-02 NOTE — Miscellaneous (Signed)
Summary: GI PV  Clinical Lists Changes  Medications: Added new medication of BISACODYL EC 5 MG TBEC (BISACODYL) Day before procedure take 2 at 3pm and 2 at 8pm. - Signed Added new medication of METOCLOPRAMIDE HCL 10 MG  TABS (METOCLOPRAMIDE HCL) As per prep instructions. - Signed Added new medication of MIRALAX   POWD (POLYETHYLENE GLYCOL 3350) As per prep  instructions. - Signed Rx of BISACODYL EC 5 MG TBEC (BISACODYL) Day before procedure take 2 at 3pm and 2 at 8pm.;  #4 x 0;  Signed;  Entered by: Barton Fanny RN;  Authorized by: Hart Carwin MD;  Method used: Electronically to [facility] [address] [place], [place], [place]  [address], Ph: [phone], Fax: [phone] Rx of METOCLOPRAMIDE HCL 10 MG  TABS (METOCLOPRAMIDE HCL) As per prep instructions.;  #2 x 0;  Signed;  Entered by: Barton Fanny RN;  Authorized by: Hart Carwin MD;  Method used: Electronically to [facility] [address] [place], [place], [place]  [address], Ph: [phone], Fax: [phone] Rx of MIRALAX   POWD (POLYETHYLENE GLYCOL 3350) As per prep  instructions.;  #255gm x 0;  Signed;  Entered by: Barton Fanny RN;  Authorized by: Hart Carwin MD;  Method used: Electronically to [facility].*, [address] [place], [place], [place]  [address], Ph: [phone], Fax: [phone] Observations: Added new observation of NKA: T (11/02/2007 8:16)    Prescriptions: MIRALAX   POWD (POLYETHYLENE GLYCOL 3350) As per prep  instructions.  #255gm x 0   Entered by:   Barton Fanny RN   Authorized by:   Hart Carwin MD   Signed by:   Barton Fanny RN on 11/02/2007   Method used:   Electronically to        [facility].* (retail)       [address]       [place], [place]  [address]       Ph: [phone]       Fax: [phone]   RxID:   [id number] METOCLOPRAMIDE HCL 10 MG  TABS (METOCLOPRAMIDE HCL) As per prep  instructions.  #2 x 0   Entered by:   Barton Fanny RN   Authorized by:   Hart Carwin MD   Signed by:   Barton Fanny RN on 11/02/2007   Method used:   Electronically to        [facility] (retail)       [address]       [place], [place]  [address]       Ph: [phone]       Fax: [phone]   RxID:   [id number] BISACODYL EC 5 MG TBEC (BISACODYL) Day before procedure take 2 at 3pm and 2 at 8pm.  #4 x 0   Entered by:   Barton Fanny RN   Authorized by:   Hart Carwin MD   Signed by:   Barton Fanny RN on 11/02/2007   Method used:   Electronically to        [facility].* (retail)       [address]       [place], [place]  [address]       Ph: [phone]  Fax: [phone]   RxID:   [id number]

## 2010-02-02 NOTE — Assessment & Plan Note (Signed)
Summary: 6 mo f/u      Allergies Added: NKDA  Visit Type:  6 mo f/u Primary Provider:  Kelle Darting, MD  CC:  no cardiac complaints today.  History of Present Illness: Mr Adam Gilbert comes in today for further management on artery disease and hyperlipidemia.  He is able to take 10 mg of Lipitor. His lipids are not at goal is I have reviewed with him today, however, he is taking a statin.  He continues to smoke.   He is having no angina or ischemic symptoms. He denies orthopnea, PND, peripheral edema, but does have some mild dyspnea on exertion which is stable.  Clinical Reports Reviewed:  Cardiac Cath:  11/12/2007: Cardiac Cath Findings:      CONCLUSION:   1. Coronary artery disease with non-ST-elevation myocardial infarction       with 95% stenosis in the mid left anterior descending, 50%       narrowing in the diagonal branch and 70% systolic compression in       the mid-to-distal left anterior descending with no significant       obstruction in the circumflex, right coronaries, and a small area       of hypokinesis at the apex with an estimated ejection fraction of       60%.   2. Successful percutaneous coronary intervention of the left anterior       descending and diagonal branch bifurcation lesion, which improved       in the left anterior descending from 95% to 0% using a Palmaz drug-       eluting stent, and salvage and preservation of the diagonal branch       with the balloon angioplasty with 50% narrowing before and after       intervention.      DISPOSITION:  The patient was returned to Postanesthesia room for   further observation.  He is to remain on Plavix for at least a year and   probably longer.               Bruce Elvera Lennox Juanda Chance, MD, [facility]   Electronically Signed      Nuclear Study:  02/01/2008:  Excerise capacity: good exercise capacity  Blood Pressure response: Normal blood pressure response  Clinical symptoms: Dyspnea, calf tightness  ECG  impression: No significant ST segment change suggestive of ischemia  Overall impression: Probable normal perfusion and soft tissue attenuation (diaphragm). CAnnot completely exclude subendocardial scar. No evidence of ischemia. Overall low rsik scan.  Dietrich Pates, MD   Current Medications (verified): 1)  Metoprolol Tartrate 25 Mg Tabs (Metoprolol Tartrate) .... Take One Tab Two Times A Day 2)  Plavix 75 Mg Tabs (Clopidogrel Bisulfate) .... Once Daily 3)  Bayer Aspirin 325 Mg Tabs (Aspirin) .... Once Daily 4)  Lipitor 10 Mg Tabs (Atorvastatin Calcium) .... Take One Tablet By Mouth Daily.  Allergies (verified): No Known Drug Allergies  Past History:  Past Medical History: Last updated: 08-28-08 CHEST DISCOMFORT (ICD-786.59) CORONARY ARTERY DISEASE, S/P PTCA , drug-eluting stent placed in LAD November 2009 HYPERLIPIDEMIA (ICD-272.4) PVD WITH CLAUDICATION (ICD-443.89) GEN OSTEOARTHROSIS INVOLVING MULTIPLE SITES (ICD-715.09) TOBACCO ABUSE (ICD-305.1) arthritis chicken pox allergies polyps in colon  Past Surgical History: Last updated: August 28, 2008 heart catherization 2 stents, drug-eluting in the LAD, 2009  Family History: Last updated: 28-Aug-2008 Father: deceased - MI Mother:  Siblings: 1 brother - healthy               1 sister -  pace maker  no colon cancer  Social History: Last updated: 08/01/2008 Occupation:[employer] Married Alcohol use-no Drug use-no Former Smoker  quit 3/ 2010    Risk Factors: Smoking Status: quit (06/27/2008)  Review of Systems       negative other than history of present illness  Vital Signs:  Patient profile:   64 year old male Height:      72 inches Weight:      184 pounds BMI:     25.05 Pulse rate:   68 / minute Pulse rhythm:   regular BP sitting:   98 / 62  (left arm) Cuff size:   large  Vitals Entered By: Danielle Rankin, CMA (March 06, 2009 12:22 PM)  Physical Exam  General:  Well developed, well nourished, in  no acute distress. Head:  normocephalic and atraumatic Eyes:  PERRLA/EOM intact; conjunctiva and lids normal. Chest Rajan Burgard:  no deformities or breast masses noted Lungs:  decreased breath sounds throughout Heart:  Non-displaced PMI, chest non-tender; regular rate and rhythm, S1, S2 without murmurs, rubs or gallops. Carotid upstroke normal, no bruit. Normal abdominal aortic size, no bruits. Femorals normal pulses, no bruits. Pedals normal pulses. No edema, no varicosities. Abdomen:  Bowel sounds positive; abdomen soft and non-tender without masses, organomegaly, or hernias noted. No hepatosplenomegaly. Msk:  Back normal, normal gait. Muscle strength and tone normal. Pulses:  pulses normal in all 4 extremities Extremities:  No clubbing or cyanosis. Neurologic:  Alert and oriented x 3. Skin:  Intact without lesions or rashes. Psych:  Normal affect.   Impression & Recommendations:  Problem # 1:  CORONARY ARTERY DISEASE, S/P PTCA (ICD-414.9) Assessment Unchanged  His updated medication list for this problem includes:    Metoprolol Tartrate 25 Mg Tabs (Metoprolol tartrate) .Marland Kitchen... Take one tab two times a day    Plavix 75 Mg Tabs (Clopidogrel bisulfate) ..... Once daily    Bayer Aspirin 325 Mg Tabs (Aspirin) ..... Once daily  His updated medication list for this problem includes:    Metoprolol Tartrate 25 Mg Tabs (Metoprolol tartrate) .Marland Kitchen... Take one tab two times a day    Plavix 75 Mg Tabs (Clopidogrel bisulfate) ..... Once daily    Bayer Aspirin 325 Mg Tabs (Aspirin) ..... Once daily  Problem # 2:  PVD WITH CLAUDICATION (ICD-443.89) Assessment: Unchanged  Problem # 3:  HYPERLIPIDEMIA (ICD-272.4) Assessment: Improved Ihave reviewed his lipid panel with him today. He is clearly not at goal but he is taking some statin. Hopefully the anti-inflammatory component of this drug is doing him some good. Unfortunately, he continues to smoke which negates some of this. His updated medication list for  this problem includes:    Lipitor 10 Mg Tabs (Atorvastatin calcium) .Marland Kitchen... Take one tablet by mouth daily.  His updated medication list for this problem includes:    Lipitor 10 Mg Tabs (Atorvastatin calcium) .Marland Kitchen... Take one tablet by mouth daily.  Problem # 4:  TOBACCO ABUSE (ICD-305.1) Assessment: Unchanged He has been canceled to quit  Patient Instructions: 1)  Your physician recommends that you schedule a follow-up appointment in: 6 MONTHS WITH DR Ameilia Rattan DUE SEPT 2011 2)  Your physician recommends that you continue on your current medications as directed. Please refer to the Current Medication list given to you today.

## 2010-02-02 NOTE — Progress Notes (Signed)
Summary: labs mailed  Phone Note Call from Patient   Summary of Call: pt would like print out of labs  Follow-up for Phone Call        labs mailed Follow-up by: Kern Reap CMA,  May 13, 2008 2:14 PM

## 2010-02-02 NOTE — Progress Notes (Signed)
Summary: question about palvix and paper work   Phone Note Call from Patient Call back at [phone]   Caller: Spouse/MARY Summary of Call: Calling about the plavix medication,and checking on paper work. Initial call taken by: Judie Grieve,  March 26, 2009 2:01 PM  Follow-up for Phone Call        [facility] Scherrie Bateman, LPN  March 30, 2009 8:46 AM spoke with wife re forms left will address today wife aware. Follow-up by: Scherrie Bateman, LPN,  March 30, 2009 9:08 AM    Prescriptions: PLAVIX 75 MG TABS (CLOPIDOGREL BISULFATE) once daily  #90 Tablet x 3   Entered by:   Scherrie Bateman, LPN   Authorized by:   Gaylord Shih, MD, [facility]   Signed by:   Scherrie Bateman, LPN on 13/24/4010   Method used:   Print then Give to Patient   RxID:   [id number] PLAVIX 75 MG TABS (CLOPIDOGREL BISULFATE) once daily  #90 Tablet x 3   Entered by:   Scherrie Bateman, LPN   Authorized by:   Gaylord Shih, MD, [facility]   Signed by:   Scherrie Bateman, LPN on 95/63/8756   Method used:   Print then Give to Patient   RxID:   [id number]

## 2010-02-02 NOTE — Miscellaneous (Signed)
  Clinical Lists Changes  Observations: Added new observation of RS STUDY: TRACER (12/05/2008 13:02)      Research Study Name: TRACER

## 2010-02-02 NOTE — Miscellaneous (Signed)
Summary: LEC PV  Clinical Lists Changes  Observations: Added new observation of NKA: T (12/10/2008 16:16)   Pt was previously scheduled for colonoscopy--has MOVIprep at home.

## 2010-02-02 NOTE — Miscellaneous (Signed)
Summary: PREVISIT/PREP/KCO  Clinical Lists Changes  Medications: Added new medication of MOVIPREP 100 GM  SOLR (PEG-KCL-NACL-NASULF-NA ASC-C) As per prep instructions. - Signed Rx of MOVIPREP 100 GM  SOLR (PEG-KCL-NACL-NASULF-NA ASC-C) As per prep instructions.;  #1 x 0;  Signed;  Entered by: Durwin Glaze RN;  Authorized by: Rachael Fee MD;  Method used: Electronically to [facility]  [address] [phone]*, [address] [place], [place], [place]  [address], Ph: [phone] or [phone], Fax: [phone] Observations: Added new observation of NKA: T (06/27/2008 13:24)    Prescriptions: MOVIPREP 100 GM  SOLR (PEG-KCL-NACL-NASULF-NA ASC-C) As per prep instructions.  #1 x 0   Entered by:   Durwin Glaze RN   Authorized by:   Rachael Fee MD   Signed by:   Durwin Glaze RN on 06/27/2008   Method used:   [facility]ronically to        [facility]  [address] [phone] (retail)       [address]       [place], [place]  [address]       Ph: [phone] or [phone]       Fax: [phone]   RxID:   [id number]

## 2010-02-02 NOTE — Letter (Signed)
Summary: Results Letter  [facility]  [address] [place], [place] [address]   Phone: [phone]  Fax: [phone]        December 19, 2008 MRN: [id number]    Adam Gilbert [address] [place], [place]  [address]    Dear [name],   Two of the polyps removed during your recent procedure was proven to be adenomatous.  These are pre-cancerous polyps that may have grown into cancers if they had not been removed.  Based on current nationally recognized surveillance guidelines, I recommend that you have a repeat colonoscopy in 5 years.  We will therefore put your information in our reminder system and will contact you in 5 years to schedule a repeat procedure.  Please call if you have any questions or concerns.       Sincerely,  Rachael Fee MD  This letter has been electronically signed by your physician.  Appended Document: Results Letter Letter mailed 12.20.10

## 2010-02-05 NOTE — Letter (Signed)
Summary: Physician's Statement for Heart Disease  Physician's Statement for Heart Disease   Imported By: Roderic Ovens 06/09/2009 16:20:41  _____________________________________________________________________  External Attachment:    Type:   Image     Comment:   External Document

## 2010-02-08 ENCOUNTER — Telehealth (INDEPENDENT_AMBULATORY_CARE_PROVIDER_SITE_OTHER): Payer: Self-pay | Admitting: *Deleted

## 2010-02-18 NOTE — Progress Notes (Signed)
   Walk in Patient Form Recieved " Pt left Labs for Review" sent to Message Nurse [facility]  February 08, 2010 9:34 AM

## 2010-03-22 LAB — CBC
HCT: 30.6 % — ABNORMAL LOW (ref 39.0–52.0)
HCT: 30.7 % — ABNORMAL LOW (ref 39.0–52.0)
HCT: 38.8 % — ABNORMAL LOW (ref 39.0–52.0)
HCT: 45.2 % (ref 39.0–52.0)
Hemoglobin: 10.5 g/dL — ABNORMAL LOW (ref 13.0–17.0)
Hemoglobin: 10.5 g/dL — ABNORMAL LOW (ref 13.0–17.0)
Hemoglobin: 13.1 g/dL (ref 13.0–17.0)
Hemoglobin: 15.4 g/dL (ref 13.0–17.0)
MCHC: 33.7 g/dL (ref 30.0–36.0)
MCHC: 34.2 g/dL (ref 30.0–36.0)
MCHC: 34.2 g/dL (ref 30.0–36.0)
MCHC: 34.2 g/dL (ref 30.0–36.0)
MCV: 94.7 fL (ref 78.0–100.0)
MCV: 95.1 fL (ref 78.0–100.0)
MCV: 95.2 fL (ref 78.0–100.0)
MCV: 95 fL (ref 78.0–100.0)
Platelets: 172 10*3/uL (ref 150–400)
Platelets: 185 10*3/uL (ref 150–400)
Platelets: 211 10*3/uL (ref 150–400)
Platelets: 257 10*3/uL (ref 150–400)
RBC: 3.21 MIL/uL — ABNORMAL LOW (ref 4.22–5.81)
RBC: 3.24 MIL/uL — ABNORMAL LOW (ref 4.22–5.81)
RBC: 4.08 MIL/uL — ABNORMAL LOW (ref 4.22–5.81)
RBC: 4.75 MIL/uL (ref 4.22–5.81)
RDW: 13.4 % (ref 11.5–15.5)
RDW: 13.7 % (ref 11.5–15.5)
RDW: 13.7 % (ref 11.5–15.5)
RDW: 13.5 % (ref 11.5–15.5)
WBC: 10 10*3/uL (ref 4.0–10.5)
WBC: 9.1 10*3/uL (ref 4.0–10.5)
WBC: 9.2 10*3/uL (ref 4.0–10.5)
WBC: 7.7 10*3/uL (ref 4.0–10.5)

## 2010-03-22 LAB — BASIC METABOLIC PANEL
BUN: 9 mg/dL (ref 6–23)
BUN: 18 mg/dL (ref 6–23)
CO2: 26 mEq/L (ref 19–32)
CO2: 27 mEq/L (ref 19–32)
Calcium: 8.4 mg/dL (ref 8.4–10.5)
Calcium: 9.6 mg/dL (ref 8.4–10.5)
Chloride: 100 mEq/L (ref 96–112)
Chloride: 108 mEq/L (ref 96–112)
Creatinine, Ser: 0.92 mg/dL (ref 0.4–1.5)
Creatinine, Ser: 0.85 mg/dL (ref 0.4–1.5)
GFR calc Af Amer: >60 mL/min (ref >60)
GFR calc Af Amer: >60 mL/min (ref >60)
GFR calc non Af Amer: >60 mL/min (ref >60)
GFR calc non Af Amer: >60 mL/min (ref >60)
Glucose, Bld: 145 mg/dL — ABNORMAL HIGH (ref 70–99)
Glucose, Bld: 93 mg/dL (ref 70–99)
Potassium: 3.8 mEq/L (ref 3.5–5.1)
Potassium: 5 mEq/L (ref 3.5–5.1)
Sodium: 135 mEq/L (ref 135–145)
Sodium: 141 mEq/L (ref 135–145)

## 2010-03-22 LAB — ABO/RH: ABO/RH(D): O POS

## 2010-03-22 LAB — SURGICAL PCR SCREEN
MRSA, PCR: NEGATIVE
Staphylococcus aureus: NEGATIVE

## 2010-03-22 LAB — TYPE AND SCREEN
ABO/RH(D): O POS
Antibody Screen: NEGATIVE

## 2010-05-18 NOTE — Cardiovascular Report (Signed)
NAMEDURRELL, BARAJAS               ACCOUNT NO.:  [id number]   MEDICAL RECORD NO.:  [id number]          PATIENT TYPE:  INP   LOCATION:  6524                         FACILITY:  [facility]   PHYSICIAN:  Bruce R. Juanda Chance, MD, FACCDATE OF BIRTH:  Mar 25, 1945   DATE OF PROCEDURE:  11/12/2007  DATE OF DISCHARGE:                            CARDIAC CATHETERIZATION   CLINICAL HISTORY:  Mr. Veiga is 65 year old and had no prior history of  known heart disease.  He is admitted to the hospital with prolonged  chest pain over the last 2-3 days intermittent and ruled in for a non-ST-  elevation myocardial infarction.  He was scheduled for evaluation with  catheterization.  He was enrolled in the tracer trial.  His  electrocardiograms were normal.   PROCEDURE:  The procedure was followed by femoral arterial sheath and 5-  [place] preformed coronary catheters.  At completion of diagnostic study,  we made decision to see with intervention on the lesion in the LAD.  The  patient was given antiemetics bolus infusion and previously he had been  loaded with aspirin and Plavix.  We used a 7-French Q-4 guiding catheter  with side holes because this was a bifurcation lesion.  We initially  passed a Prowater wire down the LAD and a second Prowater wire down the  diagonal branch.  We did dilate the LAD lesion across the diagonal  branch with a 2.25 x 20-mm apex balloon performing 2 inflations up to 8  atmospheres for 30 seconds.  We then deployed a 2.5 x 22-mm Palmaz stent  deploying this with one inflation of 10 atmospheres for 30 seconds.  We  postdilated with a 2.75 x 50-mm [place] Voyager performing 2 inflations up to  16 atmospheres for 20 seconds.  We then post dilated the proximal  portion of stent with a 3.25 x 8-mm Quantum Maverick performing 2  inflations up to 16 atmospheres for 30 seconds.  We then rewired the  diagonal branch and dilated diagonal branch through the stent with a  2.25 x 20-mm apex balloon.  We  then performed kissing balloon using a  2.25 x 20-mm apex in the diagonal branch and the 2.75 x 15-mm [place] Voyager  in the LAD and we performed simultaneous pressures up to 12 atmospheres  for 30 seconds.  Final diagnostics were then performed through the  guiding catheter.  There was a lesion downstream that appeared to be  primarily related to systolic compression up to 70% in systole, but  dilated end diastolic, elected not to treat this.  The patient tolerated  the procedure well and left the laboratory in satisfactory condition.  The right femoral artery was closed with Angio-Seal at the end of the  procedure.   RESULTS:  Left main coronary artery and left main coronary artery was  free of significant disease.   Left anterior descending artery:  Please note, left anterior descending  artery gave rise to a diagonal branch, a septal perforator, and a small  and large diagonal branch.  There was 95% stenosis in the mid LAD just  distal to the  large diagonal branch.  There was 50% narrowing in the  proximal portion of the diagonal branch extending with some disease  extending to the ostium.  There was a lesion in the mid-to-distal LAD  that was primarily systolic compression up to 70% narrowing.   The circumflex artery branch that successfully gave rise to a large  marginal branch and a posterolateral branch.  There was also a small  ramus branch and an atrial branch.  These vessels were free of  significant disease.   The right coronary artery:  The right coronary artery was a moderate-  sized vessel gave rise to conus branch to right ventricle branch,  posterior descending branch, and a posterolateral branch.  This vessel  is free of significant disease.   The left ventriculogram:  The left ventriculogram performed in the RAO  projection showed hypokinesis of a very tip of the apex.  This was a  very small area.  The estimated fraction was 60%.   Following stenting of the LAD,  the stenosis improved from 95% to 0%.  Following PTCA of the diagonal branch and then following kissing  balloon, there was initially 50% stenosis and there was residual 50%  stenosis in the diagonal branch.   The aortic pressure was 110/69 with mean of 87, left pressure was  110/07.   CONCLUSION:  1. Coronary artery disease with non-ST-elevation myocardial infarction      with 95% stenosis in the mid left anterior descending, 50%      narrowing in the diagonal branch and 70% systolic compression in      the mid-to-distal left anterior descending with no significant      obstruction in the circumflex, right coronaries, and a small area      of hypokinesis at the apex with an estimated ejection fraction of      60%.  2. Successful percutaneous coronary intervention of the left anterior      descending and diagonal branch bifurcation lesion, which improved      in the left anterior descending from 95% to 0% using a Palmaz drug-      eluting stent, and salvage and preservation of the diagonal branch      with the balloon angioplasty with 50% narrowing before and after      intervention.   DISPOSITION:  The patient was returned to Postanesthesia room for  further observation.  He is to remain on Plavix for at least a year and  probably longer.      Bruce Elvera Lennox Juanda Chance, MD, [facility]  Electronically Signed     BRB/MEDQ  D:  11/12/2007  T:  11/12/2007  Job:  [id number]   cc:   Thomas C. Daleen Squibb, MD, [facility]  Dr. Wyonia Hough  Cardiopulmonary Laboratory

## 2010-05-18 NOTE — Assessment & Plan Note (Signed)
[facility]                            CARDIOLOGY OFFICE NOTE   Gilbert, Adam Gilbert                        MRN:          [id number]  DATE:11/28/2007                            DOB:          Jun 01, 1945    Adam Gilbert comes in today for followup after presenting with a non-ST  segment elevated MI.   He had a high-grade LAD stenosis, which was stented with a drug-eluting  stent.  He had an EF of 60% with apical hypokinesia.  His enzymes did  not go that high with a peak CPK total of 250 and MB of 16.4.   His risk factors were age, sex, tobacco use, and hyperlipidemia.   Since discharge, he has only smoked on occasion.  He has done remarkably  well.  He is anxious to get back to work.  He has had no symptoms of  angina or ischemia.  He seems to be very compliant with the medications  and his wife is here, and she is a very helpful partner.   CURRENT MEDICATIONS:  1. Lipitor 40 mg a day.  2. Aspirin 325 mg a day.  3. Plavix 75 mg a day.  4. Metoprolol 25 mg p.o. b.i.d.  5. Tracer study drug 2.5 mg.  6. He carries sublingual nitroglycerin.   ALLERGIES:  He has no known drug allergies.   PHYSICAL EXAMINATION:  VITAL SIGNS:  His blood pressure is 117/82, pulse  is 74 and regular, and his weight is 184.  HEENT:  Normal.  NECK:  Carotid upstrokes were equal bilaterally without bruits.  No JVD.  Thyroid is not enlarged.  Trachea is midline.  LUNGS:  Clear to auscultation and percussion.  HEART:  Regular rate and rhythm.  No murmur, rub, or gallop.  ABDOMEN:  Soft.  No midline bruit.  Cath site is stable.  EXTREMITIES:  No cyanosis, clubbing, or edema.  Pulses are intact.  NEURO:  Intact.   EKG shows evolving changes of an anterior septal wall infarct.   I am delighted that Adam Gilbert is doing so well.  I have released him to  go back to work, not lifting more than 50 pounds.  I have encouraged him  to enroll in cardiac rehab.  We will check  lipids  and LFTs in 4 weeks.  We made no changes in his medications  today.  I will see him back again in 3 months.     Thomas C. Daleen Squibb, MD, [facility]  Electronically Signed    TCW/MedQ  DD: 11/28/2007  DT: 11/29/2007  Job #: [id number]

## 2010-05-18 NOTE — Assessment & Plan Note (Signed)
[facility]                            CARDIOLOGY OFFICE NOTE   NAME:Adam Gilbert, Adam Gilbert                        MRN:          [id number]  DATE:01/25/2008                            DOB:          16-Jun-1945    Ms. Commons comes in today for a followup.  He did have some intermittent  chest tightness, which is mostly unpredictable.  He has had it both with  exertion and at rest.   He is still smoking about 5 cigarettes a day.   He is very compliant with his medications, however.   Current meds were unchanged since his last visit.  He has not had to use  sublingual nitroglycerin.   His exam today, his blood pressure is 110/90, his pulse was 72 and  regular, his weight is 188, up 4.  HEENT is normal.  Carotids are full  without bruits.  Thyroid is not enlarged.  Trachea is midline.  Lungs  are clear to auscultation and percussion.  Heart reveals a nondisplaced  PMI.  Normal S1 and S2.  Abdominal exam is soft, good bowel sounds.  No  midline bruit.  Extremities reveal no cyanosis, clubbing, or edema.  Pulses are intact.   ASSESSMENT AND PLAN:  Mr. Petrasek chest discomfort is a bit concerning.  I will arrange an exercise rest stress Myoview to follow up on his LAD  intervention.  Hopefully, it will show no ischemia.  I have made no  changes to his medical program.  If his stress test is unremarkable, I  will see him back in 6 months.     Adam C. Daleen Squibb, MD, [facility]  Electronically Signed    TCW/MedQ  DD: 01/25/2008  DT: 01/25/2008  Job #: [id number]

## 2010-05-18 NOTE — Discharge Summary (Signed)
Adam Gilbert, Adam Gilbert               ACCOUNT NO.:  [id number]   MEDICAL RECORD NO.:  [id number]          PATIENT TYPE:  INP   LOCATION:  6524                         FACILITY:  [facility]   PHYSICIAN:  Maisie Fus C. Wall, MD, FACCDATE OF BIRTH:  12-29-1945   DATE OF ADMISSION:  11/10/2007  DATE OF DISCHARGE:  11/13/2007                               DISCHARGE SUMMARY   PRIMARY CARDIOLOGIST:  Maisie Fus C. Wall, MD, [facility]   DISCHARGE DIAGNOSES:  1. Coronary artery disease/unstable angina/non-ST elevated myocardial      infarction status post cardiac catheterization this admission.  The      patient's circumflex RCA okay, LV minimal apical hypokinesis with      EF 60%.  LAD, the patient with 95% stenosis underwent percutaneous      coronary intervention with placement of a Promus drug-eluting      stent, residual stenosis 0.  The patient tolerated procedure      without complications.  2. Tobacco abuse.  3. Dyslipidemia.   Mr. Ose is a 65 year old gentleman with past medical history as  stated above who presented to [facility] Emergency Room with chest pain  for 2-3 days.  The patient's troponin of 1.46.  A 12-lead EKG showed  normal sinus rhythm at a rate of 75 with 25 mm questionable ST elevation  in leads V3 and less than that in V2 with ST depressions in V5 and V6.  The patient was admitted, anticoagulated with heparin, continued home  medications with the addition of beta-blocker and nitroglycerin.  The  patient to the cath lab on November 12, 2007, by Dr. Charlies Constable.  Results as stated above.  The patient tolerated the procedure without  complications.  Also evaluated by cardiac rehab and tobacco cessation  nurse.  The patient agreeable to try medications for smoking cessation.  Dr. Daleen Squibb in to see the patient on day of discharge.  Vital signs stable,  afebrile.  The patient being discharged home to follow up with him in  the office.  I have arranged for him to be seen on November 28, 2007, at  2:00 p.m.  He has been given post-cardiac catheterization discharge  instructions.   Medications at discharge include Lipitor 40, aspirin 325, multivitamin  as previously taking, Plavix 75, Lopressor 25 b.i.d., tracer research  drug, nitroglycerin as needed, Chantix starter pack.  The patient has  been given prescriptions for the Chantix, nitroglycerin, Lopressor,  Plavix, and Lipitor.   DURATION OF DISCHARGE ENCOUNTER:  Over 30 minutes.      Dorian Pod, ACNP      Jesse Sans. Daleen Squibb, MD, [facility]  Electronically Signed    MB/MEDQ  D:  11/13/2007  T:  11/13/2007  Job:  [id number]

## 2010-05-18 NOTE — H&P (Signed)
NAMEGRANGER, CHUI               ACCOUNT NO.:  [id number]   MEDICAL RECORD NO.:  [id number]          PATIENT TYPE:  EMS   LOCATION:  MAJO                         FACILITY:  [facility]   PHYSICIAN:  Thomas C. Wall, MD, FACCDATE OF BIRTH:  11-24-1945   DATE OF ADMISSION:  11/10/2007  DATE OF DISCHARGE:                              HISTORY & PHYSICAL   CHIEF COMPLAINT:  Chest pain.   HISTORY OF PRESENT ILLNESS:  [name] is a 65 year old gentleman with  history of hyperlipidemia, ongoing tobacco abuse, came to the ER for  chest pain for last 2-3 days.  He had a total of 3-4 episodes, each of  lasting 5-15 minutes.  The worse one was 6-7/10, right now 3/10.  It was  exertional to start with, but became nonexertional later.  It feels  tight in the chest without any radiation.  No aggravating or relieving  factor, although it resolved spontaneously.  There is no shortness of  breath, nausea, vomiting, perspiration, visual symptoms, loss of  consciousness, acid reflux, or abdominal pain.  The patient's wife took  [name] to Urgent Care and they were told to go to hospital.  The  patient's wife drove [name] to [facility] ER.   ALLERGY HISTORY:  Not known drug allergies.   PAST MEDICAL HISTORY:  Only significant for hyperlipidemia, tobacco  abuse for 40-pack year.   SOCIAL HISTORY:  [name] lives in [place] with his wife.  He works  for [employer].  He has 40-pack-year history.  He does not drink  alcohol regularly and not abuses any illicit drugs.   FAMILY HISTORY:  Significant for massive heart attack in his father at  the age of 67 when he died.  One of his sisters had long QT and two  open heart surgeries for murmur.   HOME MEDICATIONS:  1. Lipitor 40 mg p.o. daily.  2. Aspirin 81 mg p.o. daily.  3. Over-the-counter medications like potassium for the leg cramps on      as-needed basis.  4. Calcium.  5. Vitamin C.   REVIEW OF SYSTEMS:  Negative for fever,  chills, sweats, headache,  shortness of breath, dyspnea on exertion, orthopnea, PND, edema, cough,  or wheezing.   PHYSICAL EXAMINATION:  VITAL SIGNS:  Temperature 97.9, pulse 84,  respirations 20, blood pressure 158/97, oxygen saturation 100% on room  air.  GENERAL:  He is not in acute distress.  HEAD and EYES:  PERRLA.  Extraocular muscle movement intact.  Sclerae  clear.  NECK:  Supple.  No bruit or JVD.  CARDIOVASCULAR:  First and second heart sounds normal.  Regular rate and  rhythm.  No rubs, murmurs, or gallops.  LUNGS:  Clear to auscultation bilaterally.  ABDOMEN:  Soft and nontender.  No organomegaly.  EXTREMITIES:  Pedal pulses bilaterally strong.  No pitting pedal edema.  NEUROLOGIC:  Alert and oriented x3.   Chest x-ray, COPD changes and chronic bronchitis without any acute  abnormalities.  A 12-lead EKG done in the emergency is positive for  normal sinus rhythm with a rate of 75  beats per minute with normal axis  with about 0.5 mm questionable ST elevation in lead V3 and less than  that in V2 with ST depression in lead V5 and V6.   LABORATORY DATA:  WBC 6.6 with absolute neutrophil count of 4.2,  hemoglobin 15.1, and platelets 262.  Sodium 136, potassium 4.2, chloride  105, bicarb 23, BUN 15, creatinine 0.74, glucose 88, and calcium 8.6.  Total CK 321, CK-MB 24.7, relative index 7.7.  Troponin is 1.46.   ASSESSMENT/PLAN:  [name] is a 65 year old Caucasian white male with  history of hyperlipidemia, tobacco abuse, and positive family history,  came in with chest pain.  1. Acute coronary syndrome.  We will admit the patient to a step-down      unit.  We will start on aspirin, Lopressor, heparin, and      nitroglycerin drip as well as Plavix.  We will cycle cardiac      enzymes and EKG.  He needs cardiac catheterization and if he remain      symptom free, then we will cath him on Monday which is the day      after tomorrow; otherwise if he develops any pain, we may  need to      take him to emergent cath lab.  We will also start him on Lipitor.  2. Hyperlipidemia.  We will check fasting lipid panel and start      Lipitor.  3. Tobacco abuse.  We will request cessation counseling for smoking.      Jason Coop, MD  Electronically Signed      Jesse Sans. Daleen Squibb, MD, [facility]  Electronically Signed    YP/MEDQ  D:  11/10/2007  T:  11/10/2007  Job:  [id number]   cc:   Peyton Najjar Dr. Lodema Hong

## 2010-08-06 ENCOUNTER — Encounter: Payer: Self-pay | Admitting: Cardiology

## 2010-09-07 ENCOUNTER — Encounter: Payer: Self-pay | Admitting: Cardiology

## 2010-09-07 ENCOUNTER — Ambulatory Visit (INDEPENDENT_AMBULATORY_CARE_PROVIDER_SITE_OTHER): Payer: BC Managed Care – PPO | Admitting: Cardiology

## 2010-09-07 VITALS — BP 113/86 | HR 76 | Resp 12 | Ht 72.0 in | Wt 192.0 lb

## 2010-09-07 DIAGNOSIS — I7389 Other specified peripheral vascular diseases: Secondary | ICD-10-CM

## 2010-09-07 DIAGNOSIS — I259 Chronic ischemic heart disease, unspecified: Secondary | ICD-10-CM

## 2010-09-07 DIAGNOSIS — F17200 Nicotine dependence, unspecified, uncomplicated: Secondary | ICD-10-CM

## 2010-09-07 MED ORDER — NITROGLYCERIN 0.4 MG SL SUBL: 0.4000 mg | SUBLINGUAL_TABLET | SUBLINGUAL | Status: DC | PRN

## 2010-09-07 NOTE — Progress Notes (Signed)
HPI  Adam Gilbert returns today for the evaluation and management of his coronary artery disease.he denies any angina or ischemic symptoms. He continues to smoke. He denies claudication. He has had no symptoms of TIAs or mini strokes.  He gets his blood work at the [place]. He seems to be compliant with his medications. He is not carrying sublingual nitroglycerin. We will prescribe today.  EKG is normal. Past Medical History  Diagnosis Date  . Chest discomfort   . Coronary artery disease     s/p PTCA, DES to the LAD November 2009  . Hyperlipidemia   . PVD (peripheral vascular disease) with claudication   . OA (osteoarthritis)   . Tobacco abuse   . Arthritis   . History of chicken pox   . Multiple allergies   . Colon polyps     Past Surgical History  Procedure Date  . Cardiac catheterization 2009    2 DES stents to the LAD    Family History  Problem Relation Age of Onset  . Heart attack Father     History   Social History  . Marital Status: Married    Spouse Name: N/A    Number of Children: N/A  . Years of Education: N/A   Occupational History  . Not on file.   Social History Main Topics  . Smoking status: Former Smoker    Quit date: 03/03/2008  . Smokeless tobacco: Not on file  . Alcohol Use: No  . Drug Use: No  . Sexually Active:    Other Topics Concern  . Not on file   Social History Narrative  . No narrative on file    No Known Allergies  Current Outpatient Prescriptions  Medication Sig Dispense Refill  . aspirin 325 MG tablet Take 325 mg by mouth daily.        . clopidogrel (PLAVIX) 75 MG tablet Take 75 mg by mouth daily.        . metoprolol tartrate (LOPRESSOR) 25 MG tablet 1/2 TAB PO BID      . simvastatin (ZOCOR) 20 MG tablet 1/2 TAB PO QD         ROS Negative other than HPI.   PE General Appearance: well developed, well nourished in no acute distress HEENT: symmetrical face, PERRLA, good dentition  Neck: no JVD, thyromegaly, or adenopathy,  trachea midline Chest: symmetric without deformity Cardiac: PMI non-displaced, RRR, normal S1, S2, no gallop or murmur Lung: clear to ausculation and percussion Vascular: all pulses full without bruits  Abdominal: nondistended, nontender, good bowel sounds, no HSM, no bruits Extremities: no cyanosis, clubbing or edema, no sign of DVT, no varicosities  Skin: normal color, no rashes Neuro: alert and oriented x 3, non-focal Pysch: normal affect Filed Vitals:   09/07/10 1051  BP: 113/86  Pulse: 76  Resp: 12  Height: 6' (1.829 m)  Weight: 192 lb (87.091 kg)    EKG  Labs and Studies Reviewed.   Lab Results  Component Value Date   WBC 9.2 06/05/2009   HGB 10.5* 06/05/2009   HCT 30.6* 06/05/2009   MCV 95.2 06/05/2009   PLT 185 06/05/2009      Chemistry      Component Value Date/Time   NA 135 06/03/2009 0500   K 3.8 06/03/2009 0500   CL 100 06/03/2009 0500   CO2 26 06/03/2009 0500   BUN 9 06/03/2009 0500   CREATININE 0.92 06/03/2009 0500      Component Value Date/Time   CALCIUM  8.4 06/03/2009 0500   ALKPHOS 58 09/12/2008 0935   AST 20 09/12/2008 0935   ALT 16 09/12/2008 0935   BILITOT 0.9 09/12/2008 0935       Lab Results  Component Value Date   CHOL 200 09/12/2008   CHOL 104 05/09/2008   CHOL 111 12/07/2007   Lab Results  Component Value Date   HDL 33.20* 09/12/2008   HDL 31.60* 05/09/2008   HDL 16.1* 12/07/2007   Lab Results  Component Value Date   LDLCALC 149* 09/12/2008   LDLCALC 61 05/09/2008   LDLCALC 70 12/07/2007   Lab Results  Component Value Date   TRIG 87.0 09/12/2008   TRIG 55.0 05/09/2008   TRIG 60 12/07/2007   Lab Results  Component Value Date   CHOLHDL 6 09/12/2008   CHOLHDL 3 05/09/2008   CHOLHDL 3.8 CALC 12/07/2007   No results found for this basename: HGBA1C   Lab Results  Component Value Date   ALT 16 09/12/2008   AST 20 09/12/2008   ALKPHOS 58 09/12/2008   BILITOT 0.9 09/12/2008   Lab Results  Component Value Date   TSH 0.65 05/09/2008  HEENT he

## 2010-09-07 NOTE — Assessment & Plan Note (Signed)
Stable. No change in medications.  Sublingual nitroglycerin prescribed instructions on use given.

## 2010-09-07 NOTE — Assessment & Plan Note (Signed)
No claudication with good distal pulses.

## 2010-09-07 NOTE — Patient Instructions (Signed)
Your physician recommends that you schedule a follow-up appointment in: 1 year with Dr. Wall  

## 2010-09-07 NOTE — Assessment & Plan Note (Signed)
Encouraged to stop. 

## 2010-10-05 LAB — CARDIAC PANEL(CRET KIN+CKTOT+MB+TROPI)
CK, MB: 11.8 — ABNORMAL HIGH
CK, MB: 13.6 — ABNORMAL HIGH
CK, MB: 16.4 — ABNORMAL HIGH
CK, MB: 23.9 — ABNORMAL HIGH
CK, MB: 31 — ABNORMAL HIGH
CK, MB: 41.4 — ABNORMAL HIGH
Relative Index: 5.4 — ABNORMAL HIGH
Relative Index: 6 — ABNORMAL HIGH
Relative Index: 6.2 — ABNORMAL HIGH
Relative Index: 6.6 — ABNORMAL HIGH
Relative Index: 7.1 — ABNORMAL HIGH
Relative Index: 8.7 — ABNORMAL HIGH
Total CK: 218
Total CK: 228
Total CK: 250 — ABNORMAL HIGH
Total CK: 387 — ABNORMAL HIGH
Total CK: 439 — ABNORMAL HIGH
Total CK: 476 — ABNORMAL HIGH
Troponin I: 2.11
Troponin I: 2.5
Troponin I: 2.66
Troponin I: 3.95
Troponin I: 4.59
Troponin I: 4.84

## 2010-10-05 LAB — BASIC METABOLIC PANEL
BUN: 10
BUN: 14
BUN: 15
BUN: 15
CO2: 23
CO2: 23
CO2: 24
CO2: 28
Calcium: 8.4
Calcium: 8.4
Calcium: 8.6
Calcium: 9.2
Chloride: 103
Chloride: 105
Chloride: 106
Chloride: 108
Creatinine, Ser: 0.74
Creatinine, Ser: 0.76
Creatinine, Ser: 0.86
Creatinine, Ser: 0.94
GFR calc Af Amer: >60
GFR calc Af Amer: >60
GFR calc Af Amer: >60
GFR calc Af Amer: >60
GFR calc non Af Amer: >60
GFR calc non Af Amer: >60
GFR calc non Af Amer: >60
GFR calc non Af Amer: >60
Glucose, Bld: 178 — ABNORMAL HIGH
Glucose, Bld: 88
Glucose, Bld: 94
Glucose, Bld: 97
Potassium: 3.9
Potassium: 4
Potassium: 4.2
Potassium: 4.9
Sodium: 136
Sodium: 137
Sodium: 139
Sodium: 141

## 2010-10-05 LAB — HEPARIN LEVEL (UNFRACTIONATED)
Heparin Unfractionated: 0.3
Heparin Unfractionated: 0.55
Heparin Unfractionated: 0.8 — ABNORMAL HIGH

## 2010-10-05 LAB — TSH: TSH: 1.139

## 2010-10-05 LAB — LIPID PANEL
Cholesterol: 111
Cholesterol: 99
HDL: 27 — ABNORMAL LOW
HDL: 27 — ABNORMAL LOW
LDL Cholesterol: 57
LDL Cholesterol: 61
Total CHOL/HDL Ratio: 3.7
Total CHOL/HDL Ratio: 4.1
Triglycerides: 134
Triglycerides: 53
VLDL: 11
VLDL: 27

## 2010-10-05 LAB — DIFFERENTIAL
Basophils Absolute: 0
Basophils Absolute: 0
Basophils Relative: 0
Basophils Relative: 0
Eosinophils Absolute: 0.2
Eosinophils Absolute: 0.2
Eosinophils Relative: 2
Eosinophils Relative: 3
Lymphocytes Relative: 20
Lymphocytes Relative: 27
Lymphs Abs: 1.4
Lymphs Abs: 1.8
Monocytes Absolute: 0.4
Monocytes Absolute: 0.4
Monocytes Relative: 6
Monocytes Relative: 6
Neutro Abs: 4.2
Neutro Abs: 4.9
Neutrophils Relative %: 63
Neutrophils Relative %: 72

## 2010-10-05 LAB — CBC
HCT: 41.2
HCT: 43.1
HCT: 44.6
HCT: 45.5
Hemoglobin: 14.1
Hemoglobin: 14.4
Hemoglobin: 15.1
Hemoglobin: 15.1
MCHC: 33.3
MCHC: 33.5
MCHC: 33.9
MCHC: 34.3
MCV: 92.8
MCV: 93.9
MCV: 93.9
MCV: 94
Platelets: 228
Platelets: 232
Platelets: 259
Platelets: 262
RBC: 4.44
RBC: 4.58
RBC: 4.75
RBC: 4.84
RDW: 13.4
RDW: 13.7
RDW: 13.8
RDW: 13.9
WBC: 6.3
WBC: 6.6
WBC: 6.8
WBC: 8.2

## 2010-10-05 LAB — MAGNESIUM: Magnesium: 2.1

## 2010-10-05 LAB — URINALYSIS, ROUTINE W REFLEX MICROSCOPIC
Bilirubin Urine: NEGATIVE
Glucose, UA: NEGATIVE
Hgb urine dipstick: NEGATIVE
Ketones, ur: NEGATIVE
Nitrite: NEGATIVE
Protein, ur: NEGATIVE
Specific Gravity, Urine: 1.012
Urobilinogen, UA: 0.2
pH: 6

## 2010-10-05 LAB — CK TOTAL AND CKMB (NOT AT ARMC)
CK, MB: 24.7 — ABNORMAL HIGH
Relative Index: 7.7 — ABNORMAL HIGH
Total CK: 321 — ABNORMAL HIGH

## 2010-10-05 LAB — POCT CARDIAC MARKERS
CKMB, poc: 21.5
Myoglobin, poc: 131
Troponin i, poc: 0.85

## 2010-10-05 LAB — APTT: aPTT: 31

## 2010-10-05 LAB — PROTIME-INR
INR: 1
Prothrombin Time: 13.4

## 2010-10-05 LAB — TROPONIN I: Troponin I: 1.46

## 2010-10-20 ENCOUNTER — Other Ambulatory Visit: Payer: Self-pay | Admitting: Cardiology

## 2010-11-18 IMAGING — CR DG CHEST 2V
2 series · 2 of 2 positions shown · non-contrast
Comparison: 11/10/2007.

CLINICAL DATA: History of coronary artery disease and tobacco
smoking.  Preoperative cardiopulmonary evaluation.

CHEST - 2 VIEW

[view not recorded (1 of 2)]
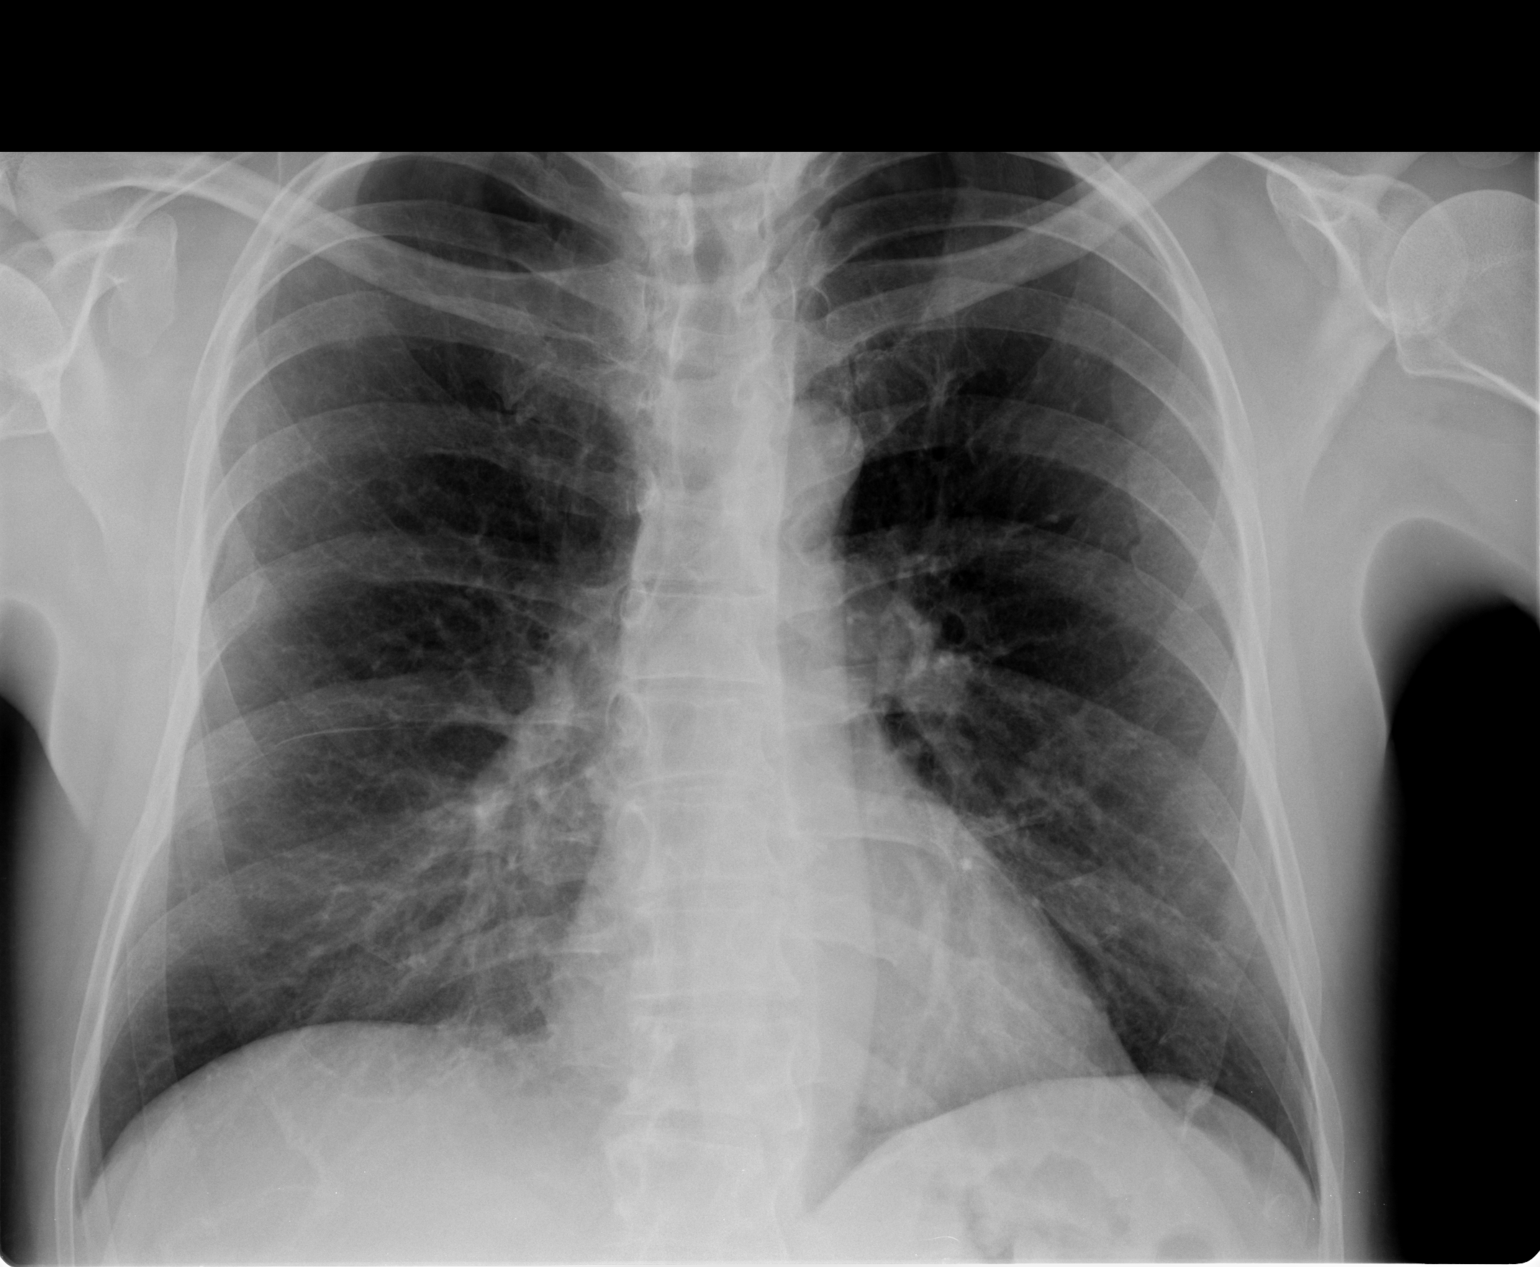

[view not recorded (2 of 2)]
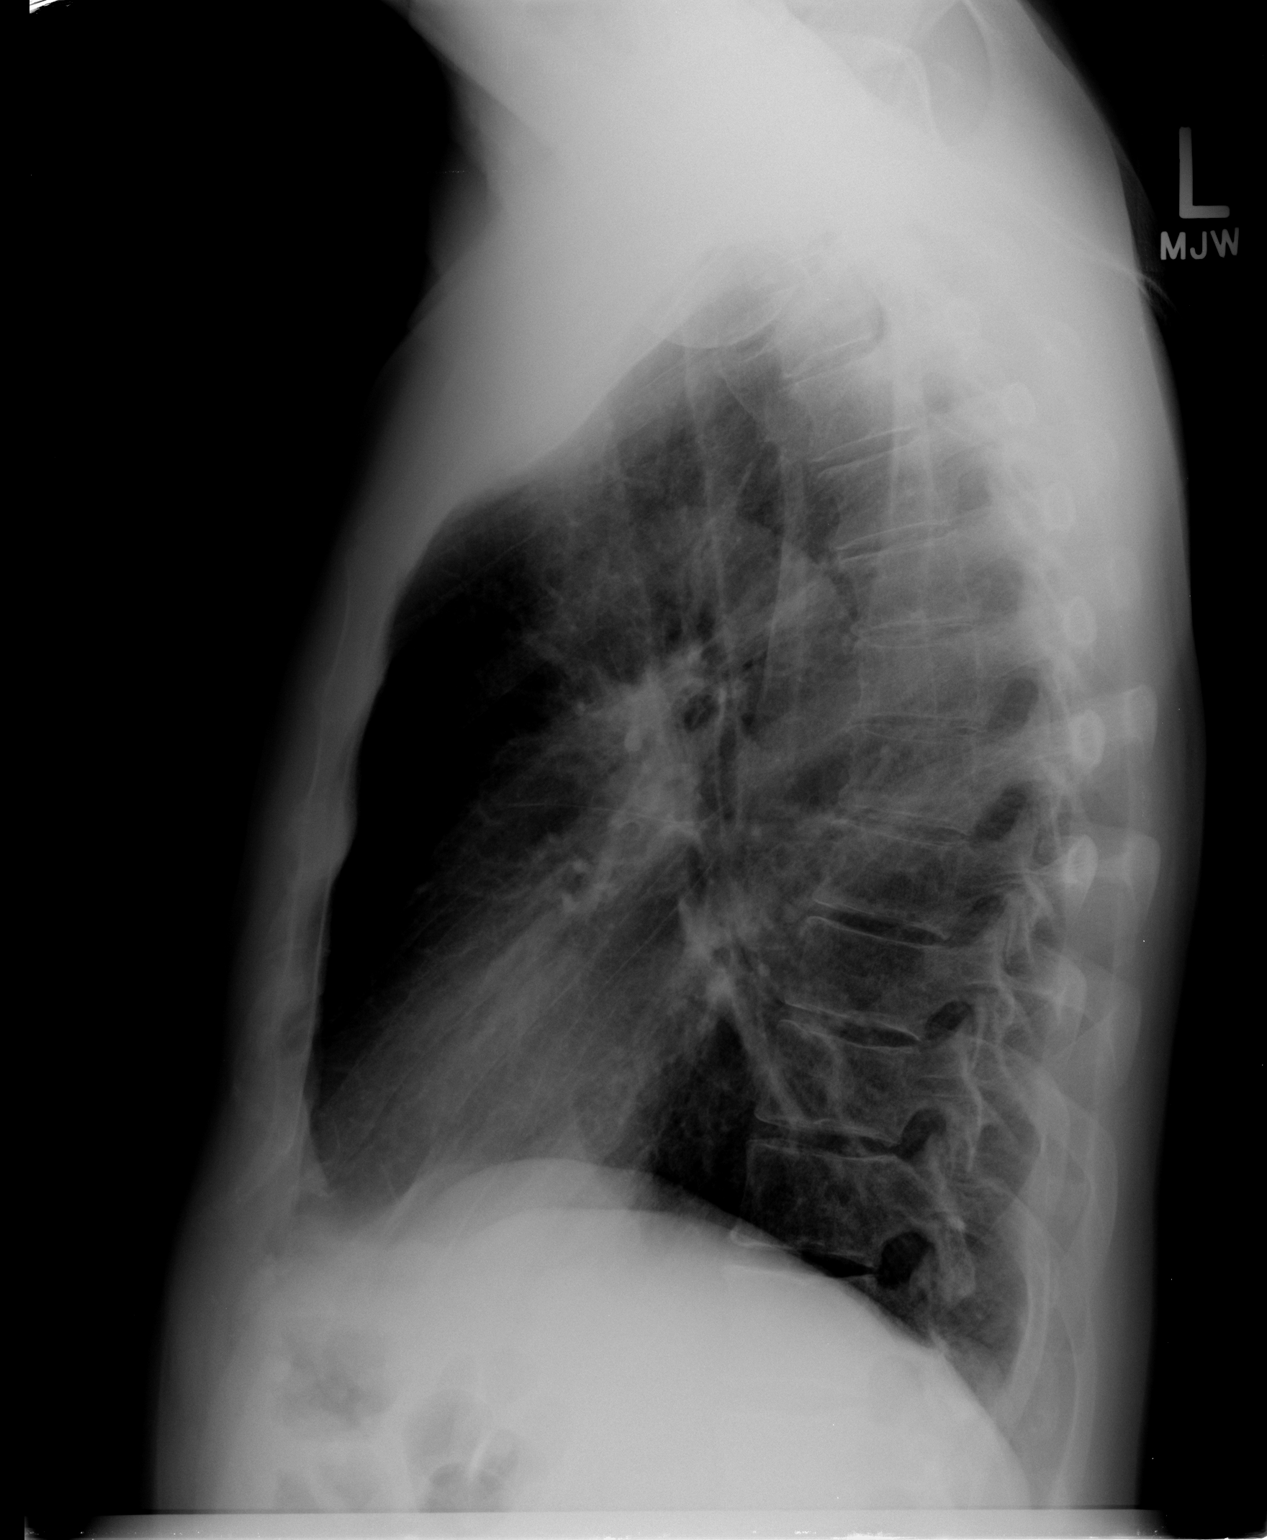

[2 of 2 positions shown; findings below may reference images not displayed]

FINDINGS: The cardiac silhouette is normal size and shape. No
pleural abnormality is evident. There is slight hyperinflation
configuration.  No pulmonary infiltrates are seen. There is mild
degenerative spondylosis compatible with age.
IMPRESSION: No acute cardiopulmonary process is identified.

## 2011-05-12 DIAGNOSIS — J0100 Acute maxillary sinusitis, unspecified: Secondary | ICD-10-CM | POA: Diagnosis not present

## 2011-06-02 DIAGNOSIS — D4010 Neoplasm of uncertain behavior of unspecified testis: Secondary | ICD-10-CM | POA: Diagnosis not present

## 2011-06-02 DIAGNOSIS — Z125 Encounter for screening for malignant neoplasm of prostate: Secondary | ICD-10-CM | POA: Diagnosis not present

## 2011-06-02 DIAGNOSIS — I259 Chronic ischemic heart disease, unspecified: Secondary | ICD-10-CM | POA: Diagnosis not present

## 2011-06-02 DIAGNOSIS — F17200 Nicotine dependence, unspecified, uncomplicated: Secondary | ICD-10-CM | POA: Diagnosis not present

## 2011-06-02 DIAGNOSIS — E7800 Pure hypercholesterolemia, unspecified: Secondary | ICD-10-CM | POA: Diagnosis not present

## 2011-06-02 DIAGNOSIS — Z23 Encounter for immunization: Secondary | ICD-10-CM | POA: Diagnosis not present

## 2011-06-09 DIAGNOSIS — N508 Other specified disorders of male genital organs: Secondary | ICD-10-CM | POA: Diagnosis not present

## 2011-06-09 DIAGNOSIS — D4010 Neoplasm of uncertain behavior of unspecified testis: Secondary | ICD-10-CM | POA: Diagnosis not present

## 2011-06-09 DIAGNOSIS — N433 Hydrocele, unspecified: Secondary | ICD-10-CM | POA: Diagnosis not present

## 2011-07-29 DIAGNOSIS — L570 Actinic keratosis: Secondary | ICD-10-CM | POA: Diagnosis not present

## 2011-07-29 DIAGNOSIS — D239 Other benign neoplasm of skin, unspecified: Secondary | ICD-10-CM | POA: Diagnosis not present

## 2011-07-29 DIAGNOSIS — L821 Other seborrheic keratosis: Secondary | ICD-10-CM | POA: Diagnosis not present

## 2011-09-02 ENCOUNTER — Encounter: Payer: Self-pay | Admitting: *Deleted

## 2011-09-07 ENCOUNTER — Ambulatory Visit (INDEPENDENT_AMBULATORY_CARE_PROVIDER_SITE_OTHER): Payer: Medicare Other | Admitting: Cardiology

## 2011-09-07 ENCOUNTER — Encounter: Payer: Self-pay | Admitting: Cardiology

## 2011-09-07 VITALS — BP 145/90 | HR 70 | Ht 72.0 in | Wt 195.8 lb

## 2011-09-07 DIAGNOSIS — F17200 Nicotine dependence, unspecified, uncomplicated: Secondary | ICD-10-CM

## 2011-09-07 DIAGNOSIS — I7389 Other specified peripheral vascular diseases: Secondary | ICD-10-CM | POA: Diagnosis not present

## 2011-09-07 DIAGNOSIS — I259 Chronic ischemic heart disease, unspecified: Secondary | ICD-10-CM

## 2011-09-07 NOTE — Patient Instructions (Addendum)
Your physician recommends that you continue on your current medications as directed. Please refer to the Current Medication list given to you today.  Your physician wants you to follow-up in: 12 months with Dr. Daleen Squibb. You will receive a reminder letter in the mail two months in advance. If you don't receive a letter, please call our office to schedule the follow-up appointment.  Your physician discussed the hazards of tobacco use. Tobacco use cessation is recommended and techniques and options to help you quit were discussed.

## 2011-09-07 NOTE — Assessment & Plan Note (Signed)
Stable. Continue secondary preventative therapy. Strongly advised to quit smoking. Return the office in one year.

## 2011-09-07 NOTE — Progress Notes (Signed)
HPI [name] comes in today for evaluation and management of his history of coronary disease. He had drug-eluting stents to his LAD in 2009. He is unremarkably well. He continues to smoke but is down to quit. He denies any symptoms of claudication. He also denies any angina or ischemic symptoms.  His blood work is followed by his primary care, Dr Jeanie Sewer.  He seems to be compliant with his medications.  Past Medical History  Diagnosis Date  . Chest discomfort   . Coronary artery disease     s/p PTCA, DES to the LAD November 2009  . Hyperlipidemia   . PVD (peripheral vascular disease) with claudication   . OA (osteoarthritis)   . Tobacco abuse   . Arthritis   . History of chicken pox   . Multiple allergies   . Colon polyps     Current Outpatient Prescriptions  Medication Sig Dispense Refill  . aspirin 325 MG tablet Take 325 mg by mouth daily.        Marland Kitchen atorvastatin (LIPITOR) 10 MG tablet Take 10 mg by mouth daily.      . clopidogrel (PLAVIX) 75 MG tablet TAKE 1 TABLET BY MOUTH EVERY DAY  90 tablet  4  . metoprolol tartrate (LOPRESSOR) 25 MG tablet 1/2 TAB PO BID      . nitroGLYCERIN (NITROSTAT) 0.4 MG SL tablet Place 1 tablet (0.4 mg total) under the tongue every 5 (five) minutes as needed for chest pain.  25 tablet  6    No Known Allergies  Family History  Problem Relation Age of Onset  . Heart attack Father     History   Social History  . Marital Status: Married    Spouse Name: N/A    Number of Children: N/A  . Years of Education: N/A   Occupational History  . High [employer]    Social History Main Topics  . Smoking status: Former Smoker    Quit date: 03/03/2008  . Smokeless tobacco: Not on file  . Alcohol Use: No  . Drug Use: No  . Sexually Active:    Other Topics Concern  . Not on file   Social History Narrative  . No narrative on file    ROS ALL NEGATIVE EXCEPT THOSE NOTED IN HPI  PE  General Appearance: well developed, well nourished in no  acute distress HEENT: symmetrical face, PERRLA, good dentition  Neck: no JVD, thyromegaly, or adenopathy, trachea midline Chest: symmetric without deformity Cardiac: PMI non-displaced, RRR, normal S1, S2, no gallop or murmur Lung: clear to ausculation and percussion Vascular: all pulses full without bruits  Abdominal: nondistended, nontender, good bowel sounds, no HSM, no bruits Extremities: no cyanosis, clubbing or edema, no sign of DVT, no varicosities  Skin: normal color, no rashes Neuro: alert and oriented x 3, non-focal Pysch: normal affect  EKG Normal sinus rhythm, incomplete right bundle, no change. BMET    Component Value Date/Time   NA 135 06/03/2009 0500   K 3.8 06/03/2009 0500   CL 100 06/03/2009 0500   CO2 26 06/03/2009 0500   GLUCOSE 145* 06/03/2009 0500   BUN 9 06/03/2009 0500   CREATININE 0.92 06/03/2009 0500   CALCIUM 8.4 06/03/2009 0500   GFRNONAA >60 06/03/2009 0500   GFRAA  Value: >60        The eGFR has been calculated using the MDRD equation. This calculation has not been validated in all clinical situations. eGFR's persistently <60 mL/min signify possible Chronic Kidney Disease.  06/03/2009 0500    Lipid Panel     Component Value Date/Time   CHOL 200 09/12/2008 0935   TRIG 87.0 09/12/2008 0935   HDL 33.20* 09/12/2008 0935   CHOLHDL 6 09/12/2008 0935   VLDL 17.4 09/12/2008 0935   LDLCALC 149* 09/12/2008 0935    CBC    Component Value Date/Time   WBC 9.2 06/05/2009 2020   RBC 3.21* 06/05/2009 2020   HGB 10.5* 06/05/2009 2020   HCT 30.6* 06/05/2009 2020   PLT 185 06/05/2009 2020   MCV 95.2 06/05/2009 2020   MCHC 34.2 06/05/2009 2020   RDW 13.4 06/05/2009 2020   LYMPHSABS 1.3 05/09/2008 1043   MONOABS 0.5 05/09/2008 1043   EOSABS 0.2 05/09/2008 1043   BASOSABS 0.0 05/09/2008 1043

## 2011-09-15 DIAGNOSIS — Z23 Encounter for immunization: Secondary | ICD-10-CM | POA: Diagnosis not present

## 2011-10-31 DIAGNOSIS — H269 Unspecified cataract: Secondary | ICD-10-CM | POA: Diagnosis not present

## 2011-10-31 DIAGNOSIS — H5200 Hypermetropia, unspecified eye: Secondary | ICD-10-CM | POA: Diagnosis not present

## 2011-12-07 DIAGNOSIS — I259 Chronic ischemic heart disease, unspecified: Secondary | ICD-10-CM | POA: Diagnosis not present

## 2011-12-07 DIAGNOSIS — E7800 Pure hypercholesterolemia, unspecified: Secondary | ICD-10-CM | POA: Diagnosis not present

## 2012-06-06 DIAGNOSIS — Z125 Encounter for screening for malignant neoplasm of prostate: Secondary | ICD-10-CM | POA: Diagnosis not present

## 2012-06-06 DIAGNOSIS — I259 Chronic ischemic heart disease, unspecified: Secondary | ICD-10-CM | POA: Diagnosis not present

## 2012-06-06 DIAGNOSIS — M1990 Unspecified osteoarthritis, unspecified site: Secondary | ICD-10-CM | POA: Diagnosis not present

## 2012-07-18 ENCOUNTER — Telehealth: Payer: Self-pay | Admitting: Cardiology

## 2012-07-18 NOTE — Telephone Encounter (Signed)
Wife is aware pt will follow-up with Dr. Clifton James in September. Appointment made for pt & reminder mailed. Mylo Red RN\

## 2012-07-18 NOTE — Telephone Encounter (Signed)
New problem   pts wife calling to see who pt will be seeing after Dr. Daleen Squibb leaves--pts name was not on the list that was sent through our email

## 2012-07-18 NOTE — Telephone Encounter (Signed)
LMOVM follow-up this September with Dr. Clifton James & for pt to call me back will schedule appointment Mylo Red RN

## 2012-09-26 ENCOUNTER — Institutional Professional Consult (permissible substitution): Payer: Medicare Other | Admitting: Cardiovascular Disease

## 2012-09-27 DIAGNOSIS — L909 Atrophic disorder of skin, unspecified: Secondary | ICD-10-CM | POA: Diagnosis not present

## 2012-09-27 DIAGNOSIS — Z23 Encounter for immunization: Secondary | ICD-10-CM | POA: Diagnosis not present

## 2012-10-23 ENCOUNTER — Encounter: Payer: Self-pay | Admitting: Cardiovascular Disease

## 2012-10-23 ENCOUNTER — Ambulatory Visit (INDEPENDENT_AMBULATORY_CARE_PROVIDER_SITE_OTHER): Payer: Medicare Other | Admitting: Cardiovascular Disease

## 2012-10-23 ENCOUNTER — Encounter: Payer: Self-pay | Admitting: *Deleted

## 2012-10-23 VITALS — BP 120/84 | HR 88 | Ht 72.0 in | Wt 201.8 lb

## 2012-10-23 DIAGNOSIS — I259 Chronic ischemic heart disease, unspecified: Secondary | ICD-10-CM | POA: Diagnosis not present

## 2012-10-23 DIAGNOSIS — I2510 Atherosclerotic heart disease of native coronary artery without angina pectoris: Secondary | ICD-10-CM | POA: Diagnosis not present

## 2012-10-23 NOTE — Progress Notes (Signed)
History of Present Illness: 67 yo male with history of CAD, HLD, tobacco abuse, OA, PAD who is here today for cardiac follow up. He has been followed in the past by Dr. Daleen Squibb. He had a NSTEMI November 2009 and had a 2.5 x 22 mm bare metal stent placed in the mid LAD. The diagonal branch was jailed by the stent. PTCA was performed in the diagonal branch. The Circumflex and the RCA were free of obstructive CAD. He is still smoking 1/2 ppd.   He is here today for follow up.  He has been feeling well. No chest pain or SOB. No claudication. No formal PAD diagnosis.   Primary Care Physician: Jeanie Sewer  Last Lipid Profile: Followed in primary care.   Past Medical History  Diagnosis Date  . Chest discomfort   . Coronary artery disease     s/p PTCA, DES to the LAD November 2009  . Hyperlipidemia   . PVD (peripheral vascular disease) with claudication   . OA (osteoarthritis)   . Tobacco abuse   . Arthritis   . History of chicken pox   . Multiple allergies   . Colon polyps     Past Surgical History  Procedure Laterality Date  . Cardiac catheterization  2009    2 DES stents to the LAD  . Back surgery  2011    x 2    Current Outpatient Prescriptions  Medication Sig Dispense Refill  . aspirin 325 MG tablet Take 325 mg by mouth daily.        Marland Kitchen atorvastatin (LIPITOR) 20 MG tablet Take 1 tablet by mouth daily.      . clopidogrel (PLAVIX) 75 MG tablet TAKE 1 TABLET BY MOUTH EVERY DAY  90 tablet  4  . metoprolol tartrate (LOPRESSOR) 25 MG tablet 1/2 TAB twice a day      . nitroGLYCERIN (NITROSTAT) 0.4 MG SL tablet Place 1 tablet (0.4 mg total) under the tongue every 5 (five) minutes as needed for chest pain.  25 tablet  6   No current facility-administered medications for this visit.    No Known Allergies  History   Social History  . Marital Status: Married    Spouse Name: N/A    Number of Children: 2  . Years of Education: N/A   Occupational History  . Retired    Social  History Main Topics  . Smoking status: Current Every Day Smoker -- 0.50 packs/day for 50 years    Types: Cigarettes    Last Attempt to Quit: 01/03/1966  . Smokeless tobacco: Not on file  . Alcohol Use: 0.5 oz/week    1 drink(s) per week  . Drug Use: No  . Sexual Activity: Not on file   Other Topics Concern  . Not on file   Social History Narrative  . No narrative on file    Family History  Problem Relation Age of Onset  . Heart attack Father     Review of Systems:  As stated in the HPI and otherwise negative.   BP 120/84  Pulse 88  Ht 6' (1.829 m)  Wt 201 lb 12.8 oz (91.536 kg)  BMI 27.36 kg/m2  Physical Examination: General: Well developed, well nourished, NAD HEENT: OP clear, mucus membranes moist SKIN: warm, dry. No rashes. Neuro: No focal deficits Musculoskeletal: Muscle strength 5/5 all ext Psychiatric: Mood and affect normal Neck: No JVD, no carotid bruits, no thyromegaly, no lymphadenopathy. Lungs:Clear bilaterally, no wheezes, rhonci, crackles Cardiovascular:  Regular rate and rhythm. No murmurs, gallops or rubs. Abdomen:Soft. Bowel sounds present. Non-tender.  Extremities: No lower extremity edema. Pulses are 2 + in the bilateral DP/PT.  EKG:NSR, rate 88 bpm. Incomplete RBBB  Cardiac cath 11/12/07:  Left main coronary artery and left main coronary artery was  free of significant disease.  Left anterior descending artery: Please note, left anterior descending  artery gave rise to a diagonal branch, a septal perforator, and a small  and large diagonal branch. There was 95% stenosis in the mid LAD just  distal to the large diagonal branch. There was 50% narrowing in the  proximal portion of the diagonal branch extending with some disease  extending to the ostium. There was a lesion in the mid-to-distal LAD  that was primarily systolic compression up to 70% narrowing.  The circumflex artery branch that successfully gave rise to a large  marginal branch and  a posterolateral branch. There was also a small  ramus branch and an atrial branch. These vessels were free of  significant disease.  The right coronary artery: The right coronary artery was a moderate-  sized vessel gave rise to conus branch to right ventricle branch,  posterior descending branch, and a posterolateral branch. This vessel  is free of significant disease.  The left ventriculogram: The left ventriculogram performed in the RAO  projection showed hypokinesis of a very tip of the apex. This was a  very small area. The estimated fraction was 60%.   Assessment and Plan:   1. CAD: He has stable CAD. No chest pain. He is on good medical therapy. He has had no ischemic evaluation in 5 years. Will arrange plain treadmill exercise stress test. Will arrange echo to assess LVEF. No medication changes.

## 2012-10-23 NOTE — Patient Instructions (Signed)
Your physician wants you to follow-up in: one year You will receive a reminder letter in the mail two months in advance. If you don't receive a letter, please call our office to schedule the follow-up appointment.  Your physician has requested that you have an echocardiogram. Echocardiography is a painless test that uses sound waves to create images of your heart. It provides your doctor with information about the size and shape of your heart and how well your heart's chambers and valves are working. This procedure takes approximately one hour. There are no restrictions for this procedure.  Your physician has requested that you have an exercise tolerance test. For further information please visit [email or web address] Please also follow instruction sheet, as given.

## 2012-11-12 ENCOUNTER — Ambulatory Visit (HOSPITAL_COMMUNITY): Payer: Medicare Other | Attending: Cardiovascular Disease | Admitting: Radiology

## 2012-11-12 DIAGNOSIS — F17200 Nicotine dependence, unspecified, uncomplicated: Secondary | ICD-10-CM | POA: Insufficient documentation

## 2012-11-12 DIAGNOSIS — E785 Hyperlipidemia, unspecified: Secondary | ICD-10-CM | POA: Insufficient documentation

## 2012-11-12 DIAGNOSIS — I2510 Atherosclerotic heart disease of native coronary artery without angina pectoris: Secondary | ICD-10-CM | POA: Insufficient documentation

## 2012-11-12 DIAGNOSIS — I259 Chronic ischemic heart disease, unspecified: Secondary | ICD-10-CM

## 2012-11-12 NOTE — Progress Notes (Signed)
Echocardiogram performed.  

## 2012-11-28 DIAGNOSIS — L570 Actinic keratosis: Secondary | ICD-10-CM | POA: Diagnosis not present

## 2012-11-28 DIAGNOSIS — L821 Other seborrheic keratosis: Secondary | ICD-10-CM | POA: Diagnosis not present

## 2012-12-03 ENCOUNTER — Ambulatory Visit (INDEPENDENT_AMBULATORY_CARE_PROVIDER_SITE_OTHER): Payer: Medicare Other | Admitting: Physician Assistant

## 2012-12-03 DIAGNOSIS — I2510 Atherosclerotic heart disease of native coronary artery without angina pectoris: Secondary | ICD-10-CM

## 2012-12-03 DIAGNOSIS — I259 Chronic ischemic heart disease, unspecified: Secondary | ICD-10-CM

## 2012-12-03 NOTE — Progress Notes (Signed)
tExercise Treadmill Test  Pre-Exercise Testing Evaluation Rhythm: normal sinus  Rate: 82     Test  Exercise Tolerance Test Ordering MD: Melene Muller, MD  Interpreting MD: Tereso Newcomer, PA-C  Unique Test No: 1  Treadmill:  1  Indication for ETT: known ASHD  Contraindication to ETT: No   Stress Modality: exercise - treadmill  Cardiac Imaging Performed: non   Protocol: standard Bruce - maximal  Max BP:  159/70  Max MPHR (bpm):  154 85% MPR (bpm):  131  MPHR obtained (bpm):  150 % MPHR obtained:  97  Reached 85% MPHR (min:sec):  6:43 Total Exercise Time (min-sec):  9:00  Workload in METS:  10.1 Borg Scale: 17  Reason ETT Terminated:  patient's desire to stop    ST Segment Analysis At Rest: normal ST segments - no evidence of significant ST depression With Exercise: no evidence of significant ST depression  Other Information Arrhythmia:  No Angina during ETT:  absent (0) Quality of ETT:  diagnostic  ETT Interpretation:  normal - no evidence of ischemia by ST analysis  Comments: Good exercise capacity. No chest pain. Normal BP response to exercise. No ST changes to suggest ischemia.   Recommendations: F/u with Dr. Verne Carrow as directed. Signed,  Tereso Newcomer, PA-C   12/03/2012 11:20 AM

## 2013-01-28 DIAGNOSIS — H269 Unspecified cataract: Secondary | ICD-10-CM | POA: Diagnosis not present

## 2013-01-28 DIAGNOSIS — H26499 Other secondary cataract, unspecified eye: Secondary | ICD-10-CM | POA: Diagnosis not present

## 2013-06-27 DIAGNOSIS — Z23 Encounter for immunization: Secondary | ICD-10-CM | POA: Diagnosis not present

## 2013-06-27 DIAGNOSIS — Z125 Encounter for screening for malignant neoplasm of prostate: Secondary | ICD-10-CM | POA: Diagnosis not present

## 2013-06-27 DIAGNOSIS — I259 Chronic ischemic heart disease, unspecified: Secondary | ICD-10-CM | POA: Diagnosis not present

## 2013-06-27 DIAGNOSIS — Z0000 Encounter for general adult medical examination without abnormal findings: Secondary | ICD-10-CM | POA: Diagnosis not present

## 2013-06-27 DIAGNOSIS — R413 Other amnesia: Secondary | ICD-10-CM | POA: Diagnosis not present

## 2013-06-27 DIAGNOSIS — D239 Other benign neoplasm of skin, unspecified: Secondary | ICD-10-CM | POA: Diagnosis not present

## 2013-08-29 DIAGNOSIS — M48061 Spinal stenosis, lumbar region without neurogenic claudication: Secondary | ICD-10-CM | POA: Diagnosis not present

## 2013-08-29 DIAGNOSIS — M4310 Spondylolisthesis, site unspecified: Secondary | ICD-10-CM | POA: Diagnosis not present

## 2013-08-29 DIAGNOSIS — IMO0002 Reserved for concepts with insufficient information to code with codable children: Secondary | ICD-10-CM | POA: Diagnosis not present

## 2013-09-08 DIAGNOSIS — M19029 Primary osteoarthritis, unspecified elbow: Secondary | ICD-10-CM | POA: Diagnosis not present

## 2013-09-11 DIAGNOSIS — IMO0002 Reserved for concepts with insufficient information to code with codable children: Secondary | ICD-10-CM | POA: Diagnosis not present

## 2013-09-23 DIAGNOSIS — Z23 Encounter for immunization: Secondary | ICD-10-CM | POA: Diagnosis not present

## 2013-10-15 DIAGNOSIS — J209 Acute bronchitis, unspecified: Secondary | ICD-10-CM | POA: Diagnosis not present

## 2013-11-05 DIAGNOSIS — Z1211 Encounter for screening for malignant neoplasm of colon: Secondary | ICD-10-CM | POA: Diagnosis not present

## 2013-11-05 DIAGNOSIS — Z86010 Personal history of colonic polyps: Secondary | ICD-10-CM | POA: Diagnosis not present

## 2013-11-05 DIAGNOSIS — F17200 Nicotine dependence, unspecified, uncomplicated: Secondary | ICD-10-CM | POA: Diagnosis not present

## 2013-11-06 ENCOUNTER — Encounter: Payer: Self-pay | Admitting: Cardiovascular Disease

## 2013-11-06 ENCOUNTER — Ambulatory Visit (INDEPENDENT_AMBULATORY_CARE_PROVIDER_SITE_OTHER): Payer: Medicare Other | Admitting: Cardiovascular Disease

## 2013-11-06 VITALS — BP 120/80 | HR 76 | Ht 72.0 in | Wt 198.4 lb

## 2013-11-06 DIAGNOSIS — I2510 Atherosclerotic heart disease of native coronary artery without angina pectoris: Secondary | ICD-10-CM

## 2013-11-06 DIAGNOSIS — E785 Hyperlipidemia, unspecified: Secondary | ICD-10-CM

## 2013-11-06 DIAGNOSIS — Z720 Tobacco use: Secondary | ICD-10-CM | POA: Diagnosis not present

## 2013-11-06 NOTE — Progress Notes (Signed)
History of Present Illness: 68 yo male with history of CAD, HLD, tobacco abuse, OA, PAD who is here today for cardiac follow up. He has been followed in the past by Dr. Verl Blalock. He had a NSTEMI November 2009 and had a 2.5 x 22 mm bare metal stent placed in the mid LAD. The diagonal branch was jailed by the stent. PTCA was performed in the diagonal branch. The Circumflex and the RCA were free of obstructive CAD. He is still smoking 1/2 ppd. Treadmill stress test 12/03/12 with good exercise tolerance, no ischemic EKG changes. Echo November 2014 with normal LV function. No valve disease.   He is here today for follow up.  He has been feeling well. No chest pain or SOB. No claudication. He has been walking five miles per day. He has a colonoscopy planned with Dr. Jackquline Denmark in [facility].   Primary Care Physician: Lin Landsman  Last Lipid Profile: Followed in primary care.   Past Medical History  Diagnosis Date  . Chest discomfort   . Coronary artery disease     s/p PTCA, DES to the LAD November 2009  . Hyperlipidemia   . PVD (peripheral vascular disease) with claudication   . OA (osteoarthritis)   . Tobacco abuse   . Arthritis   . History of chicken pox   . Multiple allergies   . Colon polyps     Past Surgical History  Procedure Laterality Date  . Cardiac catheterization  2009    2 DES stents to the LAD  . Back surgery  2011    x 2    Current Outpatient Prescriptions  Medication Sig Dispense Refill  . aspirin 81 MG tablet Take 81 mg by mouth daily.    Marland Kitchen atorvastatin (LIPITOR) 20 MG tablet Take 1 tablet by mouth daily.    . clopidogrel (PLAVIX) 75 MG tablet TAKE 1 TABLET BY MOUTH EVERY DAY 90 tablet 4  . metoprolol tartrate (LOPRESSOR) 25 MG tablet 1/2 TAB twice a day    . nitroGLYCERIN (NITROSTAT) 0.4 MG SL tablet Place 1 tablet (0.4 mg total) under the tongue every 5 (five) minutes as needed for chest pain. 25 tablet 6   No current facility-administered  medications for this visit.    No Known Allergies  History   Social History  . Marital Status: Married    Spouse Name: N/A    Number of Children: 2  . Years of Education: N/A   Occupational History  . Retired    Social History Main Topics  . Smoking status: Current Every Day Smoker -- 0.50 packs/day for 50 years    Types: Cigarettes    Last Attempt to Quit: 01/03/1966  . Smokeless tobacco: Not on file  . Alcohol Use: 0.5 oz/week    1 drink(s) per week  . Drug Use: No  . Sexual Activity: Not on file   Other Topics Concern  . Not on file   Social History Narrative    Family History  Problem Relation Age of Onset  . Heart attack Father     Review of Systems:  As stated in the HPI and otherwise negative.   BP 120/80 mmHg  Pulse 76  Ht 6' (1.829 m)  Wt 198 lb 6.4 oz (89.994 kg)  BMI 26.90 kg/m2  Physical Examination: General: Well developed, well nourished, NAD HEENT: OP clear, mucus membranes moist SKIN: warm, dry. No rashes. Neuro: No focal deficits Musculoskeletal: Muscle strength 5/5 all ext  Psychiatric: Mood and affect normal Neck: No JVD, no carotid bruits, no thyromegaly, no lymphadenopathy. Lungs:Clear bilaterally, no wheezes, rhonci, crackles Cardiovascular: Regular rate and rhythm. No murmurs, gallops or rubs. Abdomen:Soft. Bowel sounds present. Non-tender.  Extremities: No lower extremity edema. Pulses are 2 + in the bilateral DP/PT.  EKG:NSR, rate 76 bpm.  Cardiac cath 11/12/07:  Left main coronary artery and left main coronary artery was  free of significant disease.  Left anterior descending artery: Please note, left anterior descending  artery gave rise to a diagonal branch, a septal perforator, and a small  and large diagonal branch. There was 95% stenosis in the mid LAD just  distal to the large diagonal branch. There was 50% narrowing in the  proximal portion of the diagonal branch extending with some disease  extending to the ostium.  There was a lesion in the mid-to-distal LAD  that was primarily systolic compression up to 70% narrowing.  The circumflex artery branch that successfully gave rise to a large  marginal branch and a posterolateral branch. There was also a small  ramus branch and an atrial branch. These vessels were free of  significant disease.  The right coronary artery: The right coronary artery was a moderate-  sized vessel gave rise to conus branch to right ventricle branch,  posterior descending branch, and a posterolateral branch. This vessel  is free of significant disease.  The left ventriculogram: The left ventriculogram performed in the RAO  projection showed hypokinesis of a very tip of the apex. This was a  very small area. The estimated fraction was 60%.   Echo 11/12/12: Left ventricle: The cavity size was normal. Wall thickness was normal. Systolic function was normal. The estimated ejection fraction was in the range of 55% to 60%. Wall motion was normal; there were no regional wall motion abnormalities. Doppler parameters are consistent with abnormal left ventricular relaxation (grade 1 diastolic dysfunction). - Mitral valve: Calcified annulus.  Assessment and Plan:   1. CAD: He has stable CAD. No chest pain. He is on good medical therapy. He has had no ischemic evaluation in 5 years. Will arrange plain treadmill exercise stress test. Echo November 2014 with normal LVEF. No medication changes today. Will continue ASA, Plavix, statin and beta blocker.   2. HLD: Continue statin. Lipids are followed in primary care. Most recent lipid profile is not available for my review.   3. Tobacco abuse: Smoking cessation counseling provided. He wishes to stop smoking.   4. Pre-operative cardiovascular examination: He can proceed with his planned colonoscopy without ischemic testing. He will need to stop Plavix 5 days before his planned procedure.

## 2013-11-06 NOTE — Patient Instructions (Signed)
Your physician wants you to follow-up in:  12 months.  You will receive a reminder letter in the mail two months in advance. If you don't receive a letter, please call our office to schedule the follow-up appointment.   

## 2013-11-07 ENCOUNTER — Telehealth: Payer: Self-pay | Admitting: Cardiovascular Disease

## 2013-11-07 NOTE — Telephone Encounter (Signed)
New message    Wife is asking what's the best thing to take for back pain. H/o back surgery nov 2011 .

## 2013-11-07 NOTE — Telephone Encounter (Signed)
Patient is taking Tylenol for back pain. Had surgery in 2011, but doesn't see neurosurgeon any longer. PCP said Tylenol was fine, but patient states it is not helping. Has no contraindications to using Ibuprofen or Aleve, so will try one of these. Advised to call his PCP back if still in pain. Voices good understanding.

## 2013-12-02 DIAGNOSIS — D2372 Other benign neoplasm of skin of left lower limb, including hip: Secondary | ICD-10-CM | POA: Diagnosis not present

## 2013-12-02 DIAGNOSIS — L821 Other seborrheic keratosis: Secondary | ICD-10-CM | POA: Diagnosis not present

## 2013-12-02 DIAGNOSIS — L814 Other melanin hyperpigmentation: Secondary | ICD-10-CM | POA: Diagnosis not present

## 2013-12-02 DIAGNOSIS — D225 Melanocytic nevi of trunk: Secondary | ICD-10-CM | POA: Diagnosis not present

## 2013-12-11 DIAGNOSIS — Z1211 Encounter for screening for malignant neoplasm of colon: Secondary | ICD-10-CM | POA: Diagnosis not present

## 2013-12-11 DIAGNOSIS — E780 Pure hypercholesterolemia: Secondary | ICD-10-CM | POA: Diagnosis not present

## 2013-12-11 DIAGNOSIS — Z86010 Personal history of colonic polyps: Secondary | ICD-10-CM | POA: Diagnosis not present

## 2013-12-11 DIAGNOSIS — D125 Benign neoplasm of sigmoid colon: Secondary | ICD-10-CM | POA: Diagnosis not present

## 2013-12-11 DIAGNOSIS — I252 Old myocardial infarction: Secondary | ICD-10-CM | POA: Diagnosis not present

## 2013-12-11 DIAGNOSIS — K648 Other hemorrhoids: Secondary | ICD-10-CM | POA: Diagnosis not present

## 2013-12-11 DIAGNOSIS — F17210 Nicotine dependence, cigarettes, uncomplicated: Secondary | ICD-10-CM | POA: Diagnosis not present

## 2013-12-11 DIAGNOSIS — Z79899 Other long term (current) drug therapy: Secondary | ICD-10-CM | POA: Diagnosis not present

## 2013-12-11 DIAGNOSIS — I10 Essential (primary) hypertension: Secondary | ICD-10-CM | POA: Diagnosis not present

## 2013-12-11 DIAGNOSIS — K5730 Diverticulosis of large intestine without perforation or abscess without bleeding: Secondary | ICD-10-CM | POA: Diagnosis not present

## 2013-12-11 DIAGNOSIS — Z7982 Long term (current) use of aspirin: Secondary | ICD-10-CM | POA: Diagnosis not present

## 2014-01-07 ENCOUNTER — Encounter: Payer: Self-pay | Admitting: Gastroenterology

## 2014-02-05 ENCOUNTER — Telehealth: Payer: Self-pay | Admitting: Cardiovascular Disease

## 2014-02-05 NOTE — Telephone Encounter (Signed)
Spoke with pt's wife. She reports pt smokes 1 pack of cigarettes per day and is very motivated to quit. He would like to try Chantix.  Will forward to Dr. Angelena Form to determine if OK for pt to take. If OK needs prescription [facility] as outlined below.

## 2014-02-05 NOTE — Telephone Encounter (Signed)
New message       Pt want a presc for chantix.  Ins will pay for a 90 day supply of .5mg  for 165.00 and the 1mg  is 400.00.  The quantity limit is 56 tablet per 28 days.  [facility] [place] [place]. Please call pt when pres is called in

## 2014-02-06 MED ORDER — VARENICLINE TARTRATE 0.5 MG PO TABS: ORAL_TABLET | ORAL | Status: DC

## 2014-02-06 NOTE — Telephone Encounter (Signed)
Informed pt's wife that prescription was sent over to pharmacy. She verbalized understanding and was appreciative.

## 2014-02-06 NOTE — Telephone Encounter (Signed)
Called pharmacy to be sure that they would be able to dispense 0.5mg  of Chantix only. Pharmacist stated that they did have the Chantix 0.5mg  available and that it could be filled that way. Gave prescription information listed below per Dr. Angelena Form. Attempted to call pt to let them know that we were able to get prescription to pharmacy for him to pick up but there was no answer. Left message for pt to call back.

## 2014-02-06 NOTE — Telephone Encounter (Signed)
I am ok with him starting Chantix. The typical dosing is 0.5 mg daily for days 1-3, 0.5 mg twice daily for days 4-7 and then 1mg  thereafter. The pharmacy will have to be willing to include all 0.5 mg pills if that is the only way he can afford it. Thanks, chris

## 2014-02-06 NOTE — Telephone Encounter (Signed)
F/U ° ° ° ° ° ° ° ° ° °Pt returning call. Please call back.  °

## 2014-02-18 ENCOUNTER — Telehealth: Payer: Self-pay | Admitting: *Deleted

## 2014-02-18 NOTE — Telephone Encounter (Signed)
Spoke with pt's wife and told her Dr. Angelena Form had completed physician part of  paperwork that was dropped off in office for Chantix assistance.  Wife reports she checked box indicating pt had Medicare part D but pt does not have this coverage.  She asked me to update form.  Form updated with this information.  Completed paperwork faxed to [place] and copy sent to medical records.  Original paperwork mailed to pt per wife's request.

## 2014-02-25 ENCOUNTER — Telehealth: Payer: Self-pay | Admitting: *Deleted

## 2014-02-25 ENCOUNTER — Other Ambulatory Visit: Payer: Self-pay | Admitting: Pharmacist Clinician (PhC)/ Clinical Pharmacy Specialist

## 2014-02-25 MED ORDER — VARENICLINE TARTRATE 1 MG PO TABS: 1.0000 mg | ORAL_TABLET | Freq: Two times a day (BID) | ORAL | Status: DC

## 2014-02-25 MED ORDER — VARENICLINE TARTRATE 0.5 MG X 11 & 1 MG X 42 PO MISC: ORAL | Status: DC

## 2014-02-25 NOTE — Telephone Encounter (Signed)
Prescription for starter pack did not send electronically. I called prescription to pharmacy. They have run again and with insurance cost to pt is still $295.

## 2014-02-25 NOTE — Telephone Encounter (Signed)
Received letter from [facility] regarding prescription for Chantix being subject to limits.  Reviewed with pharmacist in office and if ordered as starting pack and then continuing pack with one refill insurance may cover.  Will send to pharmacy with these instructions.  I placed call to pt to give him this information. Left message to cal back

## 2014-03-10 ENCOUNTER — Telehealth: Payer: Self-pay | Admitting: Cardiovascular Disease

## 2014-03-10 NOTE — Telephone Encounter (Signed)
See phone call dated 02/25/14 for documentation regarding this call.

## 2014-03-10 NOTE — Telephone Encounter (Signed)
Spoke with pt's wife and gave her information.  Original price was over $400.  They will wait to see if pt qualifies for prescription assistance program.

## 2014-03-10 NOTE — Telephone Encounter (Signed)
New message       Returning Pat's call

## 2014-03-11 DIAGNOSIS — H2512 Age-related nuclear cataract, left eye: Secondary | ICD-10-CM | POA: Diagnosis not present

## 2014-03-18 NOTE — Telephone Encounter (Signed)
Pt notified he has been approved for Chantix assistance program and I would call him when medication received in office

## 2014-03-18 NOTE — Telephone Encounter (Signed)
Spoke with rep at [employer] and pt has been approved for starter and continuing pack of Chantix.  For reorders pt ID number is [id number].  Medication will be shipped out today and should arrive in our office in 7-10 days. I placed call to pt to give him this information. Left message to call back

## 2014-03-21 ENCOUNTER — Telehealth: Payer: Self-pay | Admitting: Cardiovascular Disease

## 2014-03-21 NOTE — Telephone Encounter (Signed)
New message      Missing strength on chantix presc.  Please call before tuesday

## 2014-03-21 NOTE — Telephone Encounter (Signed)
Spoke with rep at [employer] and they need prescription instructions for continuing pack of Chantix.  Information to be left on Adam Gilbert's voicemail.  I was transferred to this voicemail and requested information left on voicemail.

## 2014-03-31 ENCOUNTER — Telehealth: Payer: Self-pay

## 2014-03-31 NOTE — Telephone Encounter (Signed)
Called patient to let him know that his chantix samples are here for him to pick up

## 2014-03-31 NOTE — Telephone Encounter (Signed)
Chantix received in office from assistance program.  I spoke with wife and went over instructions on how to use.  Samples left at front desk for pt to pick up.

## 2014-07-01 DIAGNOSIS — G4700 Insomnia, unspecified: Secondary | ICD-10-CM | POA: Diagnosis not present

## 2014-07-01 DIAGNOSIS — Z9181 History of falling: Secondary | ICD-10-CM | POA: Diagnosis not present

## 2014-07-01 DIAGNOSIS — D539 Nutritional anemia, unspecified: Secondary | ICD-10-CM | POA: Diagnosis not present

## 2014-07-01 DIAGNOSIS — Z125 Encounter for screening for malignant neoplasm of prostate: Secondary | ICD-10-CM | POA: Diagnosis not present

## 2014-07-01 DIAGNOSIS — R5383 Other fatigue: Secondary | ICD-10-CM | POA: Diagnosis not present

## 2014-07-01 DIAGNOSIS — D559 Anemia due to enzyme disorder, unspecified: Secondary | ICD-10-CM | POA: Diagnosis not present

## 2014-07-01 DIAGNOSIS — I259 Chronic ischemic heart disease, unspecified: Secondary | ICD-10-CM | POA: Diagnosis not present

## 2014-07-01 DIAGNOSIS — Z0000 Encounter for general adult medical examination without abnormal findings: Secondary | ICD-10-CM | POA: Diagnosis not present

## 2014-07-01 DIAGNOSIS — N529 Male erectile dysfunction, unspecified: Secondary | ICD-10-CM | POA: Diagnosis not present

## 2014-08-19 ENCOUNTER — Encounter: Payer: Self-pay | Admitting: Gastroenterology

## 2014-09-25 DIAGNOSIS — Z23 Encounter for immunization: Secondary | ICD-10-CM | POA: Diagnosis not present

## 2014-11-06 NOTE — Progress Notes (Signed)
Chief Complaint  Patient presents with  . Follow-up     History of Present Illness: 69 yo male with history of CAD, HLD, tobacco abuse, OA, PAD who is here today for cardiac follow up. He has been followed in the past by Dr. Verl Blalock. He had a NSTEMI November 2009 and had a 2.5 x 22 mm bare metal stent placed in the mid LAD. The diagonal branch was jailed by the stent. PTCA was performed in the diagonal branch. The Circumflex and the RCA were free of obstructive CAD. He is still smoking 1/2 ppd. Treadmill stress test 12/03/12 with good exercise tolerance, no ischemic EKG changes. Echo November 2014 with normal LV function. No valve disease.   He is here today for follow up.  He has been feeling well. No chest pain or SOB. No claudication. He has been walking five miles per day.  Primary Care Physician: Lin Landsman  Last Lipid Profile: Followed in primary care.   Past Medical History  Diagnosis Date  . Chest discomfort   . Coronary artery disease     s/p PTCA, DES to the LAD November 2009  . Hyperlipidemia   . PVD (peripheral vascular disease) with claudication ([place])   . OA (osteoarthritis)   . Tobacco abuse   . Arthritis   . History of chicken pox   . Multiple allergies   . Colon polyps     Past Surgical History  Procedure Laterality Date  . Cardiac catheterization  2009    2 DES stents to the LAD  . Back surgery  2011    x 2    Current Outpatient Prescriptions  Medication Sig Dispense Refill  . aspirin 81 MG tablet Take 81 mg by mouth daily.    Marland Kitchen atorvastatin (LIPITOR) 20 MG tablet Take 1 tablet by mouth daily.    . clopidogrel (PLAVIX) 75 MG tablet TAKE 1 TABLET BY MOUTH EVERY DAY 90 tablet 4  . metoprolol tartrate (LOPRESSOR) 25 MG tablet Take 12.5 mg by mouth 2 (two) times daily. 1/2 TAB twice a day    . nitroGLYCERIN (NITROSTAT) 0.4 MG SL tablet Place 0.4 mg under the tongue every 5 (five) minutes as needed for chest pain (x 3 pillsdaily).    . varenicline (CHANTIX  CONTINUING MONTH PAK) 1 MG tablet Take 1 tablet (1 mg total) by mouth 2 (two) times daily. 60 tablet 1  . varenicline (CHANTIX STARTING MONTH PAK) 0.5 MG X 11 & 1 MG X 42 tablet Take one 0.5 mg tablet by mouth once daily for 3 days, then increase to one 0.5 mg tablet twice daily for 4 days, then increase to one 1 mg tablet twice daily. 53 tablet 0   No current facility-administered medications for this visit.    No Known Allergies  Social History   Social History  . Marital Status: Married    Spouse Name: N/A  . Number of Children: 2  . Years of Education: N/A   Occupational History  . Retired    Social History Main Topics  . Smoking status: Current Every Day Smoker -- 0.50 packs/day for 50 years    Types: Cigarettes    Last Attempt to Quit: 01/03/1966  . Smokeless tobacco: Not on file  . Alcohol Use: 0.5 oz/week    1 drink(s) per week  . Drug Use: No  . Sexual Activity: Not on file   Other Topics Concern  . Not on file   Social History Narrative  Family History  Problem Relation Age of Onset  . Heart attack Father     Review of Systems:  As stated in the HPI and otherwise negative.   BP 115/82 mmHg  Pulse 69  Ht 6' (1.829 m)  Wt 209 lb (94.802 kg)  BMI 28.34 kg/m2  Physical Examination: General: Well developed, well nourished, NAD HEENT: OP clear, mucus membranes moist SKIN: warm, dry. No rashes. Neuro: No focal deficits Musculoskeletal: Muscle strength 5/5 all ext Psychiatric: Mood and affect normal Neck: No JVD, no carotid bruits, no thyromegaly, no lymphadenopathy. Lungs:Clear bilaterally, no wheezes, rhonci, crackles Cardiovascular: Regular rate and rhythm. No murmurs, gallops or rubs. Abdomen:Soft. Bowel sounds present. Non-tender.  Extremities: No lower extremity edema. Pulses are 2 + in the bilateral DP/PT.  Cardiac cath 11/12/07:  Left main coronary artery and left main coronary artery was  free of significant disease.  Left anterior  descending artery: Please note, left anterior descending  artery gave rise to a diagonal branch, a septal perforator, and a small  and large diagonal branch. There was 95% stenosis in the mid LAD just  distal to the large diagonal branch. There was 50% narrowing in the  proximal portion of the diagonal branch extending with some disease  extending to the ostium. There was a lesion in the mid-to-distal LAD  that was primarily systolic compression up to 70% narrowing.  The circumflex artery branch that successfully gave rise to a large  marginal branch and a posterolateral branch. There was also a small  ramus branch and an atrial branch. These vessels were free of  significant disease.  The right coronary artery: The right coronary artery was a moderate-  sized vessel gave rise to conus branch to right ventricle branch,  posterior descending branch, and a posterolateral branch. This vessel  is free of significant disease.  The left ventriculogram: The left ventriculogram performed in the RAO  projection showed hypokinesis of a very tip of the apex. This was a  very small area. The estimated fraction was 60%.   Echo 11/12/12: Left ventricle: The cavity size was normal. Wall thickness was normal. Systolic function was normal. The estimated ejection fraction was in the range of 55% to 60%. Wall motion was normal; there were no regional wall motion abnormalities. Doppler parameters are consistent with abnormal left ventricular relaxation (grade 1 diastolic dysfunction). - Mitral valve: Calcified annulus.  EKG:  EKG is ordered today. The ekg ordered today demonstrates NSR, rate 69 bpm  Recent Labs: No results found for requested labs within last 365 days.     Wt Readings from Last 3 Encounters:  11/07/14 209 lb (94.802 kg)  11/06/13 198 lb 6.4 oz (89.994 kg)  10/23/12 201 lb 12.8 oz (91.536 kg)     Other studies Reviewed: Additional studies/ records that were reviewed  today include: . Review of the above records demonstrates:    Assessment and Plan:   1. CAD: He has stable CAD. No chest pain. He is on good medical therapy. Stress test December 2014 with no ischemia. Echo November 2014 with normal LVEF. No medication changes today. Will continue ASA, Plavix, statin and beta blocker.   2. HLD: Continue statin. Lipids are followed in primary care.   3. Tobacco abuse: Smoking cessation counseling provided. He wishes to stop smoking and has been doing much better with weeks at a time with no cigarettes.   Current medicines are reviewed at length with the patient today.  The patient does not  have concerns regarding medicines.  The following changes have been made:  no change  Labs/ tests ordered today include:   Orders Placed This Encounter  Procedures  . EKG 12-Lead    Disposition:   FU with me in 12  months  Signed, Lauree Chandler, MD 11/07/2014 9:49 AM    [facility] [address], [place], [place]  [address] Phone: [phone]; Fax: [phone]

## 2014-11-07 ENCOUNTER — Ambulatory Visit (INDEPENDENT_AMBULATORY_CARE_PROVIDER_SITE_OTHER): Payer: Medicare Other | Admitting: Cardiovascular Disease

## 2014-11-07 ENCOUNTER — Encounter: Payer: Self-pay | Admitting: Cardiovascular Disease

## 2014-11-07 VITALS — BP 115/82 | HR 69 | Ht 72.0 in | Wt 209.0 lb

## 2014-11-07 DIAGNOSIS — I2510 Atherosclerotic heart disease of native coronary artery without angina pectoris: Secondary | ICD-10-CM | POA: Diagnosis not present

## 2014-11-07 DIAGNOSIS — Z720 Tobacco use: Secondary | ICD-10-CM

## 2014-11-07 DIAGNOSIS — E785 Hyperlipidemia, unspecified: Secondary | ICD-10-CM

## 2014-11-07 DIAGNOSIS — I259 Chronic ischemic heart disease, unspecified: Secondary | ICD-10-CM | POA: Diagnosis not present

## 2014-11-07 NOTE — Patient Instructions (Signed)

## 2014-12-08 DIAGNOSIS — L812 Freckles: Secondary | ICD-10-CM | POA: Diagnosis not present

## 2014-12-08 DIAGNOSIS — L570 Actinic keratosis: Secondary | ICD-10-CM | POA: Diagnosis not present

## 2014-12-08 DIAGNOSIS — L820 Inflamed seborrheic keratosis: Secondary | ICD-10-CM | POA: Diagnosis not present

## 2014-12-08 DIAGNOSIS — L821 Other seborrheic keratosis: Secondary | ICD-10-CM | POA: Diagnosis not present

## 2014-12-18 DIAGNOSIS — J0100 Acute maxillary sinusitis, unspecified: Secondary | ICD-10-CM | POA: Diagnosis not present

## 2014-12-18 DIAGNOSIS — R05 Cough: Secondary | ICD-10-CM | POA: Diagnosis not present

## 2015-01-26 ENCOUNTER — Other Ambulatory Visit: Payer: Self-pay | Admitting: *Deleted

## 2015-01-26 ENCOUNTER — Telehealth: Payer: Self-pay | Admitting: *Deleted

## 2015-01-26 MED ORDER — CLOPIDOGREL BISULFATE 75 MG PO TABS: 75.0000 mg | ORAL_TABLET | Freq: Every day | ORAL | Status: DC

## 2015-01-26 MED ORDER — METOPROLOL TARTRATE 25 MG PO TABS: 12.5000 mg | ORAL_TABLET | Freq: Two times a day (BID) | ORAL | Status: DC

## 2015-01-26 MED ORDER — ATORVASTATIN CALCIUM 20 MG PO TABS: 20.0000 mg | ORAL_TABLET | Freq: Every day | ORAL | Status: DC

## 2015-01-26 NOTE — Telephone Encounter (Signed)
Patients wife called and wanted to know if Dr Angelena Form would refill his clopidogrel, metoprolol and atorvastatin to [facility]. She stated that his pcp has been refilling these, but he requires him to come in more frequently than the patient feels that he should. There is no recent lipid panel in epic, but the patients wife stated that they are willing to get a copy from the pcp. Please advise. Thanks, MI

## 2015-01-26 NOTE — Telephone Encounter (Signed)
OK to refill

## 2015-01-27 ENCOUNTER — Telehealth: Payer: Self-pay | Admitting: Cardiovascular Disease

## 2015-01-27 NOTE — Telephone Encounter (Signed)
New message      *STAT* If patient is at the pharmacy, call can be transferred to refill team.   1. Which medications need to be refilled? (please list name of each medication and dose if known) metoprolol  25 mg , clopidogrel  75 mg , atorvastatin  20 mg   2. Which pharmacy/location (including street and city if local pharmacy) is medication to be sent to?[facility]   3. Do they need a 30 day or 90 day supply? 90 days on all 3 meds.

## 2015-01-27 NOTE — Telephone Encounter (Signed)
See refill/phone encounter from yesterday.

## 2015-05-13 DIAGNOSIS — H5203 Hypermetropia, bilateral: Secondary | ICD-10-CM | POA: Diagnosis not present

## 2015-07-14 DIAGNOSIS — Z79899 Other long term (current) drug therapy: Secondary | ICD-10-CM | POA: Diagnosis not present

## 2015-07-14 DIAGNOSIS — I259 Chronic ischemic heart disease, unspecified: Secondary | ICD-10-CM | POA: Diagnosis not present

## 2015-07-14 DIAGNOSIS — R5383 Other fatigue: Secondary | ICD-10-CM | POA: Diagnosis not present

## 2015-07-14 DIAGNOSIS — Z0000 Encounter for general adult medical examination without abnormal findings: Secondary | ICD-10-CM | POA: Diagnosis not present

## 2015-07-14 DIAGNOSIS — D519 Vitamin B12 deficiency anemia, unspecified: Secondary | ICD-10-CM | POA: Diagnosis not present

## 2015-07-14 DIAGNOSIS — Z9181 History of falling: Secondary | ICD-10-CM | POA: Diagnosis not present

## 2015-07-14 DIAGNOSIS — E785 Hyperlipidemia, unspecified: Secondary | ICD-10-CM | POA: Diagnosis not present

## 2015-07-14 DIAGNOSIS — Z1389 Encounter for screening for other disorder: Secondary | ICD-10-CM | POA: Diagnosis not present

## 2015-09-10 DIAGNOSIS — Z0000 Encounter for general adult medical examination without abnormal findings: Secondary | ICD-10-CM | POA: Diagnosis not present

## 2015-10-27 ENCOUNTER — Encounter: Payer: Self-pay | Admitting: Cardiovascular Disease

## 2015-11-09 ENCOUNTER — Encounter: Payer: Self-pay | Admitting: Cardiovascular Disease

## 2015-11-09 ENCOUNTER — Ambulatory Visit (INDEPENDENT_AMBULATORY_CARE_PROVIDER_SITE_OTHER): Payer: Medicare Other | Admitting: Cardiovascular Disease

## 2015-11-09 VITALS — BP 108/80 | HR 78 | Ht 72.0 in | Wt 202.4 lb

## 2015-11-09 DIAGNOSIS — E7800 Pure hypercholesterolemia, unspecified: Secondary | ICD-10-CM

## 2015-11-09 DIAGNOSIS — Z720 Tobacco use: Secondary | ICD-10-CM | POA: Diagnosis not present

## 2015-11-09 DIAGNOSIS — I2510 Atherosclerotic heart disease of native coronary artery without angina pectoris: Secondary | ICD-10-CM

## 2015-11-09 MED ORDER — ATORVASTATIN CALCIUM 20 MG PO TABS: 20.0000 mg | ORAL_TABLET | Freq: Every day | ORAL | 3 refills | Status: DC

## 2015-11-09 MED ORDER — CLOPIDOGREL BISULFATE 75 MG PO TABS: 75.0000 mg | ORAL_TABLET | Freq: Every day | ORAL | 3 refills | Status: DC

## 2015-11-09 MED ORDER — METOPROLOL TARTRATE 25 MG PO TABS: 12.5000 mg | ORAL_TABLET | Freq: Two times a day (BID) | ORAL | 3 refills | Status: DC

## 2015-11-09 MED ORDER — NITROGLYCERIN 0.4 MG SL SUBL: 0.4000 mg | SUBLINGUAL_TABLET | SUBLINGUAL | 3 refills | Status: DC | PRN

## 2015-11-09 NOTE — Progress Notes (Signed)
Chief Complaint  Patient presents with  . Coronary Artery Disease    History of Present Illness: 70 yo male with history of CAD, HLD, tobacco abuse, OA, PAD who is here today for cardiac follow up. He has been followed in the past by Dr. Verl Blalock. He had a NSTEMI November 2009 and had a 2.5 x 22 mm bare metal stent placed in the mid LAD. The diagonal branch was jailed by the stent. PTCA was performed in the diagonal branch. The Circumflex and the RCA were free of obstructive CAD. Treadmill stress test 12/03/12 with good exercise tolerance, no ischemic EKG changes. Echo November 2014 with normal LV function. No valve disease.   He is here today for follow up.  He has been feeling well. No chest pain or SOB. No claudication. He has been walking five miles per day.  Primary Care Physician: Angelina Sheriff., MD   Past Medical History:  Diagnosis Date  . Arthritis   . Chest discomfort   . Colon polyps   . Coronary artery disease    s/p PTCA, DES to the LAD November 2009  . History of chicken pox   . Hyperlipidemia   . Multiple allergies   . OA (osteoarthritis)   . PVD (peripheral vascular disease) with claudication ([place])   . Tobacco abuse     Past Surgical History:  Procedure Laterality Date  . BACK SURGERY  2011   x 2  . CARDIAC CATHETERIZATION  2009   2 DES stents to the LAD    Current Outpatient Prescriptions  Medication Sig Dispense Refill  . aspirin 81 MG tablet Take 81 mg by mouth daily.    Marland Kitchen atorvastatin (LIPITOR) 20 MG tablet Take 1 tablet (20 mg total) by mouth daily. 90 tablet 3  . clopidogrel (PLAVIX) 75 MG tablet Take 1 tablet (75 mg total) by mouth daily. 90 tablet 3  . metoprolol tartrate (LOPRESSOR) 25 MG tablet Take 0.5 tablets (12.5 mg total) by mouth 2 (two) times daily. 90 tablet 3  . nitroGLYCERIN (NITROSTAT) 0.4 MG SL tablet Place 1 tablet (0.4 mg total) under the tongue every 5 (five) minutes as needed for chest pain (x 3 pillsdaily). 75 tablet 3   No  current facility-administered medications for this visit.     No Known Allergies  Social History   Social History  . Marital status: Married    Spouse name: N/A  . Number of children: 2  . Years of education: N/A   Occupational History  . Retired    Social History Main Topics  . Smoking status: Current Every Day Smoker    Packs/day: 0.50    Years: 50.00    Types: Cigarettes    Last attempt to quit: 01/03/1966  . Smokeless tobacco: Never Used  . Alcohol use 0.5 oz/week    1 Standard drinks or equivalent per week  . Drug use: No  . Sexual activity: Not on file   Other Topics Concern  . Not on file   Social History Narrative  . No narrative on file    Family History  Problem Relation Age of Onset  . Heart attack Father     Review of Systems:  As stated in the HPI and otherwise negative.   BP 108/80   Pulse 78   Ht 6' (1.829 m)   Wt 202 lb 6.4 oz (91.8 kg)   BMI 27.45 kg/m   Physical Examination: General: Well developed, well nourished, NAD  HEENT: OP clear, mucus membranes moist  SKIN: warm, dry. No rashes. Neuro: No focal deficits  Musculoskeletal: Muscle strength 5/5 all ext  Psychiatric: Mood and affect normal  Neck: No JVD, no carotid bruits, no thyromegaly, no lymphadenopathy.  Lungs:Clear bilaterally, no wheezes, rhonci, crackles Cardiovascular: Regular rate and rhythm. No murmurs, gallops or rubs. Abdomen:Soft. Bowel sounds present. Non-tender.  Extremities: No lower extremity edema. Pulses are 2 + in the bilateral DP/PT.  Cardiac cath 11/12/07:  Left main coronary artery and left main coronary artery was  free of significant disease.  Left anterior descending artery: Please note, left anterior descending  artery gave rise to a diagonal branch, a septal perforator, and a small  and large diagonal branch. There was 95% stenosis in the mid LAD just  distal to the large diagonal branch. There was 50% narrowing in the  proximal portion of the  diagonal branch extending with some disease  extending to the ostium. There was a lesion in the mid-to-distal LAD  that was primarily systolic compression up to 70% narrowing.  The circumflex artery branch that successfully gave rise to a large  marginal branch and a posterolateral branch. There was also a small  ramus branch and an atrial branch. These vessels were free of  significant disease.  The right coronary artery: The right coronary artery was a moderate-  sized vessel gave rise to conus branch to right ventricle branch,  posterior descending branch, and a posterolateral branch. This vessel  is free of significant disease.  The left ventriculogram: The left ventriculogram performed in the RAO  projection showed hypokinesis of a very tip of the apex. This was a  very small area. The estimated fraction was 60%.   Echo 11/12/12: Left ventricle: The cavity size was normal. Wall thickness was normal. Systolic function was normal. The estimated ejection fraction was in the range of 55% to 60%. Wall motion was normal; there were no regional wall motion abnormalities. Doppler parameters are consistent with abnormal left ventricular relaxation (grade 1 diastolic dysfunction). - Mitral valve: Calcified annulus.  EKG:  EKG is  ordered today. The ekg ordered today demonstrates NSR, rate 78 bpm. Incomplete RBBB  Recent Labs: No results found for requested labs within last 8760 hours.     Wt Readings from Last 3 Encounters:  11/09/15 202 lb 6.4 oz (91.8 kg)  11/07/14 209 lb (94.8 kg)  11/06/13 198 lb 6.4 oz (90 kg)     Other studies Reviewed: Additional studies/ records that were reviewed today include: . Review of the above records demonstrates:    Assessment and Plan:   1. CAD: He has stable CAD. No chest pain. He is on good medical therapy. Stress test December 2014 with no ischemia. Echo November 2014 with normal LVEF. No medication changes today. Will continue  ASA, Plavix, statin and beta blocker.   2. HLD: Continue statin. Lipids are followed in primary care.   3. Tobacco abuse: Smoking cessation counseling provided. He wishes to stop smoking    Current medicines are reviewed at length with the patient today.  The patient does not have concerns regarding medicines.  The following changes have been made:  no change  Labs/ tests ordered today include:   Orders Placed This Encounter  Procedures  . EKG 12-Lead    Disposition:   FU with me in 12  months  Signed, Lauree Chandler, MD 11/09/2015 9:49 AM    [facility] [place],  [place]  [address] Phone: [phone]; Fax: [phone]

## 2015-11-09 NOTE — Patient Instructions (Signed)
Medication Instructions:  Your physician recommends that you continue on your current medications as directed. Please refer to the Current Medication list given to you today.   Labwork: none  Testing/Procedures: none  Follow-Up: Your physician recommends that you schedule a follow-up appointment in: 12 months.  Please call our office in July or August to schedule this appointment.    Any Other Special Instructions Will Be Listed Below (If Applicable).     If you need a refill on your cardiac medications before your next appointment, please call your pharmacy.

## 2015-12-10 DIAGNOSIS — D225 Melanocytic nevi of trunk: Secondary | ICD-10-CM | POA: Diagnosis not present

## 2015-12-10 DIAGNOSIS — L570 Actinic keratosis: Secondary | ICD-10-CM | POA: Diagnosis not present

## 2015-12-10 DIAGNOSIS — L821 Other seborrheic keratosis: Secondary | ICD-10-CM | POA: Diagnosis not present

## 2016-08-15 DIAGNOSIS — M109 Gout, unspecified: Secondary | ICD-10-CM | POA: Diagnosis not present

## 2016-08-15 DIAGNOSIS — M79642 Pain in left hand: Secondary | ICD-10-CM | POA: Diagnosis not present

## 2016-09-14 DIAGNOSIS — Z23 Encounter for immunization: Secondary | ICD-10-CM | POA: Diagnosis not present

## 2016-11-10 NOTE — Progress Notes (Signed)
Chief Complaint  Patient presents with  . Coronary Artery Disease    History of Present Illness: 71 yo male with history of CAD, HLD, tobacco abuse, OA, PAD who is here today for cardiac follow up. He had a NSTEMI November 2009 and had a bare metal stent placed in the mid LAD. The diagonal branch was jailed by the stent. PTCA was performed in the diagonal branch. The Circumflex and the RCA were free of obstructive CAD. Treadmill stress test 12/03/12 with good exercise tolerance, no ischemic EKG changes. Echo November 2014 with normal LV function. No valve disease.   He is here today for follow up. The patient denies any chest pain, dyspnea, palpitations, lower extremity edema, orthopnea, PND, dizziness, near syncope or syncope. He is very active. He works on his land mowing and tending the property. Still smoking 1 ppd.   Primary Care Physician: [facility]   Past Medical History:  Diagnosis Date  . Arthritis   . Chest discomfort   . Colon polyps   . Coronary artery disease    s/p PTCA, DES to the LAD November 2009  . History of chicken pox   . Hyperlipidemia   . Multiple allergies   . OA (osteoarthritis)   . PVD (peripheral vascular disease) with claudication ([place])   . Tobacco abuse     Past Surgical History:  Procedure Laterality Date  . BACK SURGERY  2011   x 2  . CARDIAC CATHETERIZATION  2009   2 DES stents to the LAD    Current Outpatient Medications  Medication Sig Dispense Refill  . aspirin 81 MG tablet Take 81 mg by mouth daily.    Marland Kitchen atorvastatin (LIPITOR) 20 MG tablet Take 1 tablet (20 mg total) daily by mouth. 90 tablet 3  . clopidogrel (PLAVIX) 75 MG tablet Take 1 tablet (75 mg total) daily by mouth. 90 tablet 3  . diclofenac sodium (VOLTAREN) 1 % GEL Apply as needed topically (PAIN).    Marland Kitchen metoprolol tartrate (LOPRESSOR) 25 MG tablet Take 0.5 tablets (12.5 mg total) 2 (two) times daily by mouth. 90 tablet 3  . nitroGLYCERIN (NITROSTAT) 0.4 MG SL  tablet Place 1 tablet (0.4 mg total) every 5 (five) minutes as needed under the tongue for chest pain (x 3 pillsdaily). 75 tablet 3   No current facility-administered medications for this visit.     No Known Allergies  Social History   Socioeconomic History  . Marital status: Married    Spouse name: Not on file  . Number of children: 2  . Years of education: Not on file  . Highest education level: Not on file  Social Needs  . Financial resource strain: Not on file  . Food insecurity - worry: Not on file  . Food insecurity - inability: Not on file  . Transportation needs - medical: Not on file  . Transportation needs - non-medical: Not on file  Occupational History  . Occupation: Retired  Tobacco Use  . Smoking status: Current Every Day Smoker    Packs/day: 0.50    Years: 50.00    Pack years: 25.00    Types: Cigarettes    Last attempt to quit: 01/03/1966    Years since quitting: 50.8  . Smokeless tobacco: Never Used  Substance and Sexual Activity  . Alcohol use: Yes    Alcohol/week: 0.5 oz    Types: 1 Standard drinks or equivalent per week  . Drug use: No  . Sexual activity:  Not on file  Other Topics Concern  . Not on file  Social History Narrative  . Not on file    Family History  Problem Relation Age of Onset  . Heart attack Father     Review of Systems:  As stated in the HPI and otherwise negative.   BP 108/70   Pulse 81   Ht 6' (1.829 m)   Wt 210 lb 6.4 oz (95.4 kg)   SpO2 95%   BMI 28.54 kg/m   Physical Examination:  General: Well developed, well nourished, NAD  HEENT: OP clear, mucus membranes moist  SKIN: warm, dry. No rashes. Neuro: No focal deficits  Musculoskeletal: Muscle strength 5/5 all ext  Psychiatric: Mood and affect normal  Neck: No JVD, no carotid bruits, no thyromegaly, no lymphadenopathy.  Lungs:Clear bilaterally, no wheezes, rhonci, crackles Cardiovascular: Regular rate and rhythm. No murmurs, gallops or rubs. Abdomen:Soft. Bowel  sounds present. Non-tender.  Extremities: No lower extremity edema. Pulses are 2 + in the bilateral DP/PT.  Cardiac cath 11/12/07:  Left main coronary artery and left main coronary artery was  free of significant disease.  Left anterior descending artery: Please note, left anterior descending  artery gave rise to a diagonal branch, a septal perforator, and a small  and large diagonal branch. There was 95% stenosis in the mid LAD just  distal to the large diagonal branch. There was 50% narrowing in the  proximal portion of the diagonal branch extending with some disease  extending to the ostium. There was a lesion in the mid-to-distal LAD  that was primarily systolic compression up to 70% narrowing.  The circumflex artery branch that successfully gave rise to a large  marginal branch and a posterolateral branch. There was also a small  ramus branch and an atrial branch. These vessels were free of  significant disease.  The right coronary artery: The right coronary artery was a moderate-  sized vessel gave rise to conus branch to right ventricle branch,  posterior descending branch, and a posterolateral branch. This vessel  is free of significant disease.  The left ventriculogram: The left ventriculogram performed in the RAO  projection showed hypokinesis of a very tip of the apex. This was a  very small area. The estimated fraction was 60%.   Echo 11/12/12: Left ventricle: The cavity size was normal. Wall thickness was normal. Systolic function was normal. The estimated ejection fraction was in the range of 55% to 60%. Wall motion was normal; there were no regional wall motion abnormalities. Doppler parameters are consistent with abnormal left ventricular relaxation (grade 1 diastolic dysfunction). - Mitral valve: Calcified annulus.  EKG:  EKG is ordered today. The ekg ordered today demonstrates NSR, rate 77 bpm.   Recent Labs: No results found for requested labs  within last 8760 hours.     Wt Readings from Last 3 Encounters:  11/11/16 210 lb 6.4 oz (95.4 kg)  11/09/15 202 lb 6.4 oz (91.8 kg)  11/07/14 209 lb (94.8 kg)     Other studies Reviewed: Additional studies/ records that were reviewed today include: . Review of the above records demonstrates:    Assessment and Plan:   1. CAD without angina: He has no chest pain suggestive of angina. LV function normal by echo in 2014. Will continue ASA, Plavix, statin and beta blocker.  I will arrange an exercise stress test to exclude ischemia since he has had no ischemic testing since 2014.   2. HLD: Lipids are followed in  primary care in the [place]. He will get these results and bring to [place]. Continue statin.   3. Tobacco abuse: I have asked him to stop smoking. We spent over 15 minutes of the visit today reviewing the affect of tobacco abuse on his lungs and heart. He has stopped in the past for one month after using Chantix but does not want to try again. He will try a nicotine patch.    Current medicines are reviewed at length with the patient today.  The patient does not have concerns regarding medicines.  The following changes have been made:  no change  Labs/ tests ordered today include:   Orders Placed This Encounter  Procedures  . Exercise Tolerance Test  . EKG 12-Lead    Disposition:   FU with me in 12  months  Signed, Lauree Chandler, MD 11/11/2016 11:33 AM    [facility] [place], [place], [place]  [address] Phone: [phone]; Fax: [phone]

## 2016-11-11 ENCOUNTER — Ambulatory Visit (INDEPENDENT_AMBULATORY_CARE_PROVIDER_SITE_OTHER): Payer: Medicare Other | Admitting: Cardiovascular Disease

## 2016-11-11 ENCOUNTER — Encounter: Payer: Self-pay | Admitting: Cardiovascular Disease

## 2016-11-11 VITALS — BP 108/70 | HR 81 | Ht 72.0 in | Wt 210.4 lb

## 2016-11-11 DIAGNOSIS — Z720 Tobacco use: Secondary | ICD-10-CM | POA: Diagnosis not present

## 2016-11-11 DIAGNOSIS — E7800 Pure hypercholesterolemia, unspecified: Secondary | ICD-10-CM

## 2016-11-11 DIAGNOSIS — I2510 Atherosclerotic heart disease of native coronary artery without angina pectoris: Secondary | ICD-10-CM | POA: Diagnosis not present

## 2016-11-11 MED ORDER — CLOPIDOGREL BISULFATE 75 MG PO TABS: 75.0000 mg | ORAL_TABLET | Freq: Every day | ORAL | 3 refills | Status: AC

## 2016-11-11 MED ORDER — METOPROLOL TARTRATE 25 MG PO TABS: 12.5000 mg | ORAL_TABLET | Freq: Two times a day (BID) | ORAL | 3 refills | Status: AC

## 2016-11-11 MED ORDER — NITROGLYCERIN 0.4 MG SL SUBL: 0.4000 mg | SUBLINGUAL_TABLET | SUBLINGUAL | 3 refills | Status: AC | PRN

## 2016-11-11 MED ORDER — ATORVASTATIN CALCIUM 20 MG PO TABS: 20.0000 mg | ORAL_TABLET | Freq: Every day | ORAL | 3 refills | Status: AC

## 2016-11-11 NOTE — Patient Instructions (Signed)
Medication Instructions:  Your physician recommends that you continue on your current medications as directed. Please refer to the Current Medication list given to you today.   Labwork: Have lab work done at [facility] sent to our office.  Fax is [phone]  Testing/Procedures: Your physician has requested that you have an exercise tolerance test. For further information please visit [email or web address]. Please also follow instruction sheet, as given.    Follow-Up: Your physician recommends that you schedule a follow-up appointment in: 12 months. Please call our office in about 9 months to schedule this appointment    Any Other Special Instructions Will Be Listed Below (If Applicable).     If you need a refill on your cardiac medications before your next appointment, please call your pharmacy.

## 2016-12-02 ENCOUNTER — Ambulatory Visit (INDEPENDENT_AMBULATORY_CARE_PROVIDER_SITE_OTHER): Payer: Medicare Other

## 2016-12-02 DIAGNOSIS — I2510 Atherosclerotic heart disease of native coronary artery without angina pectoris: Secondary | ICD-10-CM

## 2016-12-02 LAB — EXERCISE TOLERANCE TEST
Estimated workload: 8.5 METS
Exercise duration (min): 7 min
Exercise duration (sec): 0 s
MPHR: 150 {beats}/min
Peak HR: 150 {beats}/min
Percent HR: 100 %
RPE: 15
Rest HR: 82 {beats}/min

## 2016-12-07 ENCOUNTER — Telehealth: Payer: Self-pay | Admitting: Cardiovascular Disease

## 2016-12-07 NOTE — Telephone Encounter (Signed)
Walk In pt Form-pt Dropped off Labs from [place] placed in [employer] Box.

## 2017-09-21 DIAGNOSIS — Z23 Encounter for immunization: Secondary | ICD-10-CM | POA: Diagnosis not present

## 2017-11-16 NOTE — Progress Notes (Signed)
Chief Complaint  Patient presents with  . Follow-up    CAD    History of Present Illness: 72 yo male with history of CAD, HLD, tobacco abuse, OA and PAD who is here today for cardiac follow up. He had a NSTEMI November 2009 and had a bare metal stent placed in the mid LAD. The diagonal branch was jailed by the stent. PTCA was performed in the diagonal branch. The Circumflex and the RCA were free of obstructive CAD. Treadmill stress test 12/03/12 with good exercise tolerance, no ischemic EKG changes. Echo November 2014 with normal LV function. No valve disease. Stress test November 2018 with no ischemic EKG changes.   He is here today for follow up. The patient denies any chest pain, dyspnea, palpitations, lower extremity edema, orthopnea, PND, dizziness, near syncope or syncope. He is very active. He has no exertional chest pain. He has some dyspnea with heavy exertion.   Primary Care Physician: [facility]   Past Medical History:  Diagnosis Date  . Arthritis   . Chest discomfort   . Colon polyps   . Coronary artery disease    s/p PTCA, DES to the LAD November 2009  . History of chicken pox   . Hyperlipidemia   . Multiple allergies   . OA (osteoarthritis)   . PVD (peripheral vascular disease) with claudication ([place])   . Tobacco abuse     Past Surgical History:  Procedure Laterality Date  . BACK SURGERY  2011   x 2  . CARDIAC CATHETERIZATION  2009   2 DES stents to the LAD    Current Outpatient Medications  Medication Sig Dispense Refill  . aspirin 81 MG tablet Take 81 mg by mouth daily.    Marland Kitchen atorvastatin (LIPITOR) 20 MG tablet Take 1 tablet (20 mg total) daily by mouth. 90 tablet 3  . clopidogrel (PLAVIX) 75 MG tablet Take 1 tablet (75 mg total) daily by mouth. 90 tablet 3  . diclofenac sodium (VOLTAREN) 1 % GEL Apply as needed topically (PAIN).    Marland Kitchen metoprolol tartrate (LOPRESSOR) 25 MG tablet Take 0.5 tablets (12.5 mg total) 2 (two) times daily by mouth.  90 tablet 3  . nitroGLYCERIN (NITROSTAT) 0.4 MG SL tablet Place 1 tablet (0.4 mg total) every 5 (five) minutes as needed under the tongue for chest pain (x 3 pillsdaily). 75 tablet 3  . varenicline (CHANTIX CONTINUING MONTH PAK) 1 MG tablet Take 1 tablet (1 mg total) by mouth 2 (two) times daily. 60 tablet 0  . varenicline (CHANTIX STARTING MONTH PAK) 0.5 MG X 11 & 1 MG X 42 tablet Take as directed 53 tablet 0   No current facility-administered medications for this visit.     No Known Allergies  Social History   Socioeconomic History  . Marital status: Married    Spouse name: Not on file  . Number of children: 2  . Years of education: Not on file  . Highest education level: Not on file  Occupational History  . Occupation: Retired  Scientific laboratory technician  . Financial resource strain: Not on file  . Food insecurity:    Worry: Not on file    Inability: Not on file  . Transportation needs:    Medical: Not on file    Non-medical: Not on file  Tobacco Use  . Smoking status: Current Every Day Smoker    Packs/day: 0.50    Years: 50.00    Pack years: 25.00  Types: Cigarettes    Last attempt to quit: 01/03/1966    Years since quitting: 51.9  . Smokeless tobacco: Never Used  Substance and Sexual Activity  . Alcohol use: Yes    Alcohol/week: 1.0 standard drinks    Types: 1 Standard drinks or equivalent per week  . Drug use: No  . Sexual activity: Not on file  Lifestyle  . Physical activity:    Days per week: Not on file    Minutes per session: Not on file  . Stress: Not on file  Relationships  . Social connections:    Talks on phone: Not on file    Gets together: Not on file    Attends religious service: Not on file    Active member of club or organization: Not on file    Attends meetings of clubs or organizations: Not on file    Relationship status: Not on file  . Intimate partner violence:    Fear of current or ex partner: Not on file    Emotionally abused: Not on file     Physically abused: Not on file    Forced sexual activity: Not on file  Other Topics Concern  . Not on file  Social History Narrative  . Not on file    Family History  Problem Relation Age of Onset  . Heart attack Father     Review of Systems:  As stated in the HPI and otherwise negative.   BP 114/80   Pulse 79   Ht 6' (1.829 m)   Wt 207 lb 9.6 oz (94.2 kg)   SpO2 97%   BMI 28.16 kg/m   Physical Examination:  General: Well developed, well nourished, NAD  HEENT: OP clear, mucus membranes moist  SKIN: warm, dry. No rashes. Neuro: No focal deficits  Musculoskeletal: Muscle strength 5/5 all ext  Psychiatric: Mood and affect normal  Neck: No JVD, no carotid bruits, no thyromegaly, no lymphadenopathy.  Lungs:Clear bilaterally, no wheezes, rhonci, crackles Cardiovascular: Regular rate and rhythm. No murmurs, gallops or rubs. Abdomen:Soft. Bowel sounds present. Non-tender.  Extremities: No lower extremity edema. Pulses are 2 + in the bilateral DP/PT.  Cardiac cath 11/12/07:  Left main coronary artery and left main coronary artery was  free of significant disease.  Left anterior descending artery: Please note, left anterior descending  artery gave rise to a diagonal branch, a septal perforator, and a small  and large diagonal branch. There was 95% stenosis in the mid LAD just  distal to the large diagonal branch. There was 50% narrowing in the  proximal portion of the diagonal branch extending with some disease  extending to the ostium. There was a lesion in the mid-to-distal LAD  that was primarily systolic compression up to 70% narrowing.  The circumflex artery branch that successfully gave rise to a large  marginal branch and a posterolateral branch. There was also a small  ramus branch and an atrial branch. These vessels were free of  significant disease.  The right coronary artery: The right coronary artery was a moderate-  sized vessel gave rise to conus branch to  right ventricle branch,  posterior descending branch, and a posterolateral branch. This vessel  is free of significant disease.  The left ventriculogram: The left ventriculogram performed in the RAO  projection showed hypokinesis of a very tip of the apex. This was a  very small area. The estimated fraction was 60%.   Echo 11/12/12: Left ventricle: The cavity size was normal.  Wall thickness was normal. Systolic function was normal. The estimated ejection fraction was in the range of 55% to 60%. Wall motion was normal; there were no regional wall motion abnormalities. Doppler parameters are consistent with abnormal left ventricular relaxation (grade 1 diastolic dysfunction). - Mitral valve: Calcified annulus.  EKG:  EKG is ordered today. The ekg ordered today demonstrates Sinus, rate 79 bpm. PVC  Recent Labs: No results found for requested labs within last 8760 hours.     Wt Readings from Last 3 Encounters:  11/17/17 207 lb 9.6 oz (94.2 kg)  11/11/16 210 lb 6.4 oz (95.4 kg)  11/09/15 202 lb 6.4 oz (91.8 kg)     Other studies Reviewed: Additional studies/ records that were reviewed today include: . Review of the above records demonstrates:    Assessment and Plan:   1. CAD without angina: Normal stress test in November 2018. No chest pain. Continue ASA, Plavix, statin and beta blocker.     2. HLD: Lipids are followed in primary care in the [place]. Continue statin  3. Tobacco abuse: Smoking cessation is advised. He is willing to try Chantix. We will send this in.   Current medicines are reviewed at length with the patient today.  The patient does not have concerns regarding medicines.  The following changes have been made:  no change  Labs/ tests ordered today include:   No orders of the defined types were placed in this encounter.  Disposition:   FU with me in 12  months  Signed, Lauree Chandler, MD 11/17/2017 10:20 AM    [facility] [place], [place], [place]  [address] Phone: [phone]; Fax: [phone]

## 2017-11-17 ENCOUNTER — Ambulatory Visit (INDEPENDENT_AMBULATORY_CARE_PROVIDER_SITE_OTHER): Payer: Medicare Other | Admitting: Cardiovascular Disease

## 2017-11-17 ENCOUNTER — Encounter: Payer: Self-pay | Admitting: Cardiovascular Disease

## 2017-11-17 VITALS — BP 114/80 | HR 79 | Ht 72.0 in | Wt 207.6 lb

## 2017-11-17 DIAGNOSIS — I2510 Atherosclerotic heart disease of native coronary artery without angina pectoris: Secondary | ICD-10-CM | POA: Diagnosis not present

## 2017-11-17 DIAGNOSIS — Z720 Tobacco use: Secondary | ICD-10-CM | POA: Diagnosis not present

## 2017-11-17 DIAGNOSIS — E7800 Pure hypercholesterolemia, unspecified: Secondary | ICD-10-CM | POA: Diagnosis not present

## 2017-11-17 MED ORDER — VARENICLINE TARTRATE 0.5 MG X 11 & 1 MG X 42 PO MISC: ORAL | 0 refills | Status: DC

## 2017-11-17 MED ORDER — VARENICLINE TARTRATE 1 MG PO TABS: 1.0000 mg | ORAL_TABLET | Freq: Two times a day (BID) | ORAL | 0 refills | Status: DC

## 2017-11-17 NOTE — Addendum Note (Signed)
Addended by: Thompson Grayer on: 11/17/2017 10:24 AM   Modules accepted: Orders

## 2017-11-17 NOTE — Addendum Note (Signed)
Addended by: Mendel Ryder on: 11/17/2017 03:04 PM   Modules accepted: Orders

## 2017-11-17 NOTE — Patient Instructions (Signed)
Medication Instructions:  Your physician has recommended you make the following change in your medication:  Start chantix.  Take as directed.    If you need a refill on your cardiac medications before your next appointment, please call your pharmacy.   Lab work: none If you have labs (blood work) drawn today and your tests are completely normal, you will receive your results only by: Marland Kitchen MyChart Message (if you have MyChart) OR . A paper copy in the mail If you have any lab test that is abnormal or we need to change your treatment, we will call you to review the results.  Testing/Procedures: none  Follow-Up: At [facility], you and your health needs are our priority.  As part of our continuing mission to provide you with exceptional heart care, we have created designated Provider Care Teams.  These Care Teams include your primary Cardiologist (physician) and Advanced Practice Providers (APPs -  Physician Assistants and Nurse Practitioners) who all work together to provide you with the care you need, when you need it. You will need a follow up appointment in 12 months.  Please call our office 2 months in advance to schedule this appointment.  You may see Lauree Chandler, MD or one of the following Advanced Practice Providers on your designated Care Team:   Rulo, PA-C Melina Copa, PA-C . Ermalinda Barrios, PA-C  Any Other Special Instructions Will Be Listed Below (If Applicable).

## 2018-01-31 DIAGNOSIS — R1111 Vomiting without nausea: Secondary | ICD-10-CM | POA: Diagnosis not present

## 2018-01-31 DIAGNOSIS — R404 Transient alteration of awareness: Secondary | ICD-10-CM | POA: Diagnosis not present

## 2018-01-31 DIAGNOSIS — W19XXXA Unspecified fall, initial encounter: Secondary | ICD-10-CM | POA: Diagnosis not present

## 2018-01-31 DIAGNOSIS — R6521 Severe sepsis with septic shock: Secondary | ICD-10-CM | POA: Diagnosis not present

## 2018-01-31 DIAGNOSIS — N179 Acute kidney failure, unspecified: Secondary | ICD-10-CM | POA: Diagnosis not present

## 2018-01-31 DIAGNOSIS — E872 Acidosis: Secondary | ICD-10-CM | POA: Diagnosis not present

## 2018-01-31 DIAGNOSIS — R569 Unspecified convulsions: Secondary | ICD-10-CM | POA: Diagnosis not present

## 2018-01-31 DIAGNOSIS — R079 Chest pain, unspecified: Secondary | ICD-10-CM | POA: Diagnosis not present

## 2018-01-31 DIAGNOSIS — R55 Syncope and collapse: Secondary | ICD-10-CM | POA: Diagnosis not present

## 2018-01-31 DIAGNOSIS — I2510 Atherosclerotic heart disease of native coronary artery without angina pectoris: Secondary | ICD-10-CM | POA: Diagnosis not present

## 2018-01-31 DIAGNOSIS — E119 Type 2 diabetes mellitus without complications: Secondary | ICD-10-CM | POA: Diagnosis not present

## 2018-01-31 DIAGNOSIS — A419 Sepsis, unspecified organism: Secondary | ICD-10-CM | POA: Diagnosis not present

## 2018-01-31 DIAGNOSIS — R0902 Hypoxemia: Secondary | ICD-10-CM | POA: Diagnosis not present

## 2018-01-31 DIAGNOSIS — R4020 Unspecified coma: Secondary | ICD-10-CM | POA: Diagnosis not present

## 2018-02-01 DIAGNOSIS — A419 Sepsis, unspecified organism: Secondary | ICD-10-CM | POA: Diagnosis not present

## 2018-02-01 DIAGNOSIS — I10 Essential (primary) hypertension: Secondary | ICD-10-CM | POA: Diagnosis not present

## 2018-02-01 DIAGNOSIS — I248 Other forms of acute ischemic heart disease: Secondary | ICD-10-CM | POA: Diagnosis not present

## 2018-02-01 DIAGNOSIS — E872 Acidosis: Secondary | ICD-10-CM | POA: Diagnosis not present

## 2018-02-01 DIAGNOSIS — Z7984 Long term (current) use of oral hypoglycemic drugs: Secondary | ICD-10-CM | POA: Diagnosis not present

## 2018-02-01 DIAGNOSIS — N179 Acute kidney failure, unspecified: Secondary | ICD-10-CM | POA: Diagnosis not present

## 2018-02-01 DIAGNOSIS — I2510 Atherosclerotic heart disease of native coronary artery without angina pectoris: Secondary | ICD-10-CM | POA: Diagnosis not present

## 2018-02-01 DIAGNOSIS — I340 Nonrheumatic mitral (valve) insufficiency: Secondary | ICD-10-CM | POA: Diagnosis not present

## 2018-02-01 DIAGNOSIS — E119 Type 2 diabetes mellitus without complications: Secondary | ICD-10-CM | POA: Diagnosis not present

## 2018-02-01 DIAGNOSIS — R6521 Severe sepsis with septic shock: Secondary | ICD-10-CM | POA: Diagnosis not present

## 2018-02-01 DIAGNOSIS — F17210 Nicotine dependence, cigarettes, uncomplicated: Secondary | ICD-10-CM | POA: Diagnosis not present

## 2018-02-01 DIAGNOSIS — I959 Hypotension, unspecified: Secondary | ICD-10-CM | POA: Diagnosis not present

## 2018-02-01 DIAGNOSIS — R55 Syncope and collapse: Secondary | ICD-10-CM | POA: Diagnosis not present

## 2018-02-01 DIAGNOSIS — R079 Chest pain, unspecified: Secondary | ICD-10-CM | POA: Diagnosis not present

## 2018-02-01 DIAGNOSIS — R4182 Altered mental status, unspecified: Secondary | ICD-10-CM | POA: Diagnosis not present

## 2018-02-01 DIAGNOSIS — Z7982 Long term (current) use of aspirin: Secondary | ICD-10-CM | POA: Diagnosis not present

## 2018-02-01 DIAGNOSIS — R4020 Unspecified coma: Secondary | ICD-10-CM | POA: Diagnosis not present

## 2018-02-01 DIAGNOSIS — R7989 Other specified abnormal findings of blood chemistry: Secondary | ICD-10-CM | POA: Diagnosis not present

## 2018-02-01 DIAGNOSIS — I361 Nonrheumatic tricuspid (valve) insufficiency: Secondary | ICD-10-CM | POA: Diagnosis not present

## 2018-02-01 DIAGNOSIS — Z79899 Other long term (current) drug therapy: Secondary | ICD-10-CM | POA: Diagnosis not present

## 2018-02-01 DIAGNOSIS — Z955 Presence of coronary angioplasty implant and graft: Secondary | ICD-10-CM | POA: Diagnosis not present

## 2018-02-02 DIAGNOSIS — R079 Chest pain, unspecified: Secondary | ICD-10-CM

## 2018-08-03 ENCOUNTER — Other Ambulatory Visit: Payer: Self-pay

## 2018-08-30 DIAGNOSIS — Z23 Encounter for immunization: Secondary | ICD-10-CM | POA: Diagnosis not present

## 2018-12-20 ENCOUNTER — Other Ambulatory Visit: Payer: Self-pay

## 2018-12-20 ENCOUNTER — Ambulatory Visit (INDEPENDENT_AMBULATORY_CARE_PROVIDER_SITE_OTHER): Payer: Medicare Other | Admitting: Cardiovascular Disease

## 2018-12-20 ENCOUNTER — Encounter: Payer: Self-pay | Admitting: Cardiovascular Disease

## 2018-12-20 VITALS — BP 126/86 | HR 88 | Ht 72.0 in | Wt 187.6 lb

## 2018-12-20 DIAGNOSIS — I2510 Atherosclerotic heart disease of native coronary artery without angina pectoris: Secondary | ICD-10-CM

## 2018-12-20 DIAGNOSIS — E7800 Pure hypercholesterolemia, unspecified: Secondary | ICD-10-CM | POA: Diagnosis not present

## 2018-12-20 NOTE — Progress Notes (Signed)
Chief Complaint  Patient presents with  . Follow-up    CAD    History of Present Illness: 73 yo male with history of CAD, hyperlipidemia, tobacco abuse, OA, DM and PAD who is here today for cardiac follow up. He had a NSTEMI November 2009 and had a bare metal stent placed in the mid LAD. The diagonal branch was jailed by the stent. PTCA was performed in the diagonal branch. The Circumflex and the RCA were free of obstructive CAD. Treadmill stress test 12/03/12 with good exercise tolerance, no ischemic EKG changes. Echo November 2014 with normal LV function. No valve disease. Stress test November 2018 with no ischemic EKG changes. He has recently been diagnosed with diabetes and is on Jardiance.   He is here today for follow up. The patient denies any chest pain, dyspnea, palpitations, lower extremity edema, orthopnea, PND, dizziness, near syncope or syncope. He is still smoking but trying to quit.   Primary Care Physician: [facility]   Past Medical History:  Diagnosis Date  . Arthritis   . Chest discomfort   . Colon polyps   . Coronary artery disease    s/p PTCA, DES to the LAD November 2009  . History of chicken pox   . Hyperlipidemia   . Multiple allergies   . OA (osteoarthritis)   . PVD (peripheral vascular disease) with claudication ([place])   . Tobacco abuse     Past Surgical History:  Procedure Laterality Date  . BACK SURGERY  2011   x 2  . CARDIAC CATHETERIZATION  2009   2 DES stents to the LAD    Current Outpatient Medications  Medication Sig Dispense Refill  . aspirin 81 MG tablet Take 81 mg by mouth daily.    Marland Kitchen atorvastatin (LIPITOR) 20 MG tablet Take 1 tablet (20 mg total) daily by mouth. 90 tablet 3  . clopidogrel (PLAVIX) 75 MG tablet Take 1 tablet (75 mg total) daily by mouth. 90 tablet 3  . diclofenac sodium (VOLTAREN) 1 % GEL Apply as needed topically (PAIN).    Marland Kitchen empagliflozin (JARDIANCE) 10 MG TABS tablet Take 10 mg by mouth daily.    .  metoprolol tartrate (LOPRESSOR) 25 MG tablet Take 0.5 tablets (12.5 mg total) 2 (two) times daily by mouth. 90 tablet 3  . nitroGLYCERIN (NITROSTAT) 0.4 MG SL tablet Place 1 tablet (0.4 mg total) every 5 (five) minutes as needed under the tongue for chest pain (x 3 pillsdaily). 75 tablet 3  . varenicline (CHANTIX CONTINUING MONTH PAK) 1 MG tablet Take 1 tablet (1 mg total) by mouth 2 (two) times daily. 60 tablet 0  . varenicline (CHANTIX STARTING MONTH PAK) 0.5 MG X 11 & 1 MG X 42 tablet Take as directed 53 tablet 0   No current facility-administered medications for this visit.    No Known Allergies  Social History   Socioeconomic History  . Marital status: Married    Spouse name: Not on file  . Number of children: 2  . Years of education: Not on file  . Highest education level: Not on file  Occupational History  . Occupation: Retired  Tobacco Use  . Smoking status: Current Every Day Smoker    Packs/day: 0.50    Years: 50.00    Pack years: 25.00    Types: Cigarettes    Last attempt to quit: 01/03/1966    Years since quitting: 52.9  . Smokeless tobacco: Never Used  Substance and Sexual Activity  .  Alcohol use: Yes    Alcohol/week: 1.0 standard drinks    Types: 1 Standard drinks or equivalent per week  . Drug use: No  . Sexual activity: Not on file  Other Topics Concern  . Not on file  Social History Narrative  . Not on file   Social Determinants of Health   Financial Resource Strain:   . Difficulty of Paying Living Expenses: Not on file  Food Insecurity:   . Worried About Charity fundraiser in the Last Year: Not on file  . Ran Out of Food in the Last Year: Not on file  Transportation Needs:   . Lack of Transportation (Medical): Not on file  . Lack of Transportation (Non-Medical): Not on file  Physical Activity:   . Days of Exercise per Week: Not on file  . Minutes of Exercise per Session: Not on file  Stress:   . Feeling of Stress : Not on file  Social  Connections:   . Frequency of Communication with Friends and Family: Not on file  . Frequency of Social Gatherings with Friends and Family: Not on file  . Attends Religious Services: Not on file  . Active Member of Clubs or Organizations: Not on file  . Attends Archivist Meetings: Not on file  . Marital Status: Not on file  Intimate Partner Violence:   . Fear of Current or Ex-Partner: Not on file  . Emotionally Abused: Not on file  . Physically Abused: Not on file  . Sexually Abused: Not on file    Family History  Problem Relation Age of Onset  . Heart attack Father     Review of Systems:  As stated in the HPI and otherwise negative.   BP 126/86   Pulse 88   Ht 6' (1.829 m)   Wt 187 lb 9.6 oz (85.1 kg)   SpO2 96%   BMI 25.44 kg/m   Physical Examination: General: Well developed, well nourished, NAD  HEENT: OP clear, mucus membranes moist  SKIN: warm, dry. No rashes. Neuro: No focal deficits  Musculoskeletal: Muscle strength 5/5 all ext  Psychiatric: Mood and affect normal  Neck: No JVD, no carotid bruits, no thyromegaly, no lymphadenopathy.  Lungs:Clear bilaterally, no wheezes, rhonci, crackles Cardiovascular: Regular rate and rhythm. No murmurs, gallops or rubs. Abdomen:Soft. Bowel sounds present. Non-tender.  Extremities: No lower extremity edema. Pulses are 2 + in the bilateral DP/PT.  Cardiac cath 11/12/07:  Left main coronary artery and left main coronary artery was  free of significant disease.  Left anterior descending artery: Please note, left anterior descending  artery gave rise to a diagonal branch, a septal perforator, and a small  and large diagonal branch. There was 95% stenosis in the mid LAD just  distal to the large diagonal branch. There was 50% narrowing in the  proximal portion of the diagonal branch extending with some disease  extending to the ostium. There was a lesion in the mid-to-distal LAD  that was primarily systolic compression  up to 70% narrowing.  The circumflex artery branch that successfully gave rise to a large  marginal branch and a posterolateral branch. There was also a small  ramus branch and an atrial branch. These vessels were free of  significant disease.  The right coronary artery: The right coronary artery was a moderate-  sized vessel gave rise to conus branch to right ventricle branch,  posterior descending branch, and a posterolateral branch. This vessel  is free of significant  disease.  The left ventriculogram: The left ventriculogram performed in the RAO  projection showed hypokinesis of a very tip of the apex. This was a  very small area. The estimated fraction was 60%.   Echo 11/12/12: Left ventricle: The cavity size was normal. Wall thickness was normal. Systolic function was normal. The estimated ejection fraction was in the range of 55% to 60%. Wall motion was normal; there were no regional wall motion abnormalities. Doppler parameters are consistent with abnormal left ventricular relaxation (grade 1 diastolic dysfunction). - Mitral valve: Calcified annulus.  EKG:  EKG is ordered today. The ekg ordered today demonstrates NSR  Recent Labs: No results found for requested labs within last 8760 hours.     Wt Readings from Last 3 Encounters:  12/20/18 187 lb 9.6 oz (85.1 kg)  11/17/17 207 lb 9.6 oz (94.2 kg)  11/11/16 210 lb 6.4 oz (95.4 kg)     Other studies Reviewed: Additional studies/ records that were reviewed today include: . Review of the above records demonstrates:    Assessment and Plan:   1. CAD without angina: Normal stress test in November 2018. He has no chest pain. Will continue ASA, Plavix, beta blocker and statin.      2. HLD: Lipids are followed in primary care in the [place]. Will continue statin.   3. Tobacco abuse: He is trying to quit. He is on Chantix.   Current medicines are reviewed at length with the patient today.  The patient does not have  concerns regarding medicines.  The following changes have been made:  no change  Labs/ tests ordered today include:   Orders Placed This Encounter  Procedures  . EKG 12-Lead   Disposition:   FU with me in 12  months  Signed, Lauree Chandler, MD 12/20/2018 3:17 PM    [facility] [address], [place], [place]  [address] Phone: [phone]; Fax: [phone]

## 2018-12-20 NOTE — Patient Instructions (Signed)

## 2018-12-25 ENCOUNTER — Encounter: Payer: Self-pay | Admitting: Gastroenterology

## 2019-01-04 HISTORY — PX: PROSTATE BIOPSY: SHX241

## 2019-02-27 ENCOUNTER — Encounter: Payer: Self-pay | Admitting: *Deleted

## 2019-02-27 NOTE — Telephone Encounter (Signed)
Erroneous encounter

## 2019-08-15 ENCOUNTER — Telehealth: Payer: Self-pay | Admitting: *Deleted

## 2019-08-15 NOTE — Telephone Encounter (Signed)
Dr Julianne Handler please comment on the request to hold Plavix 5 days pre op- and 5 days post op in this patient.  He had a NSTEMI November 2009 and had a bare metal stent placed in the mid LAD. The diagonal branch was jailed by the stent. PTCA was performed in the diagonal branch. The Circumflex and the RCA were free of obstructive CAD.  Low risk GXT 2018, LOV Dec 2020.  Please respond to  CV DIV PRE OP   Thanks  Mao Lockner PA-C 08/15/2019 1:20 PM

## 2019-08-15 NOTE — Telephone Encounter (Signed)
   [facility] Pre-operative Risk Assessment    HEARTCARE STAFF: - Please ensure there is not already an duplicate clearance open for this procedure. - Under Visit Info/Reason for Call, type in Other and utilize the format Clearance MM/DD/YY or Clearance TBD. Do not use dashes or single digits. - If request is for dental extraction, please clarify the # of teeth to be extracted.  Request for surgical clearance:  1. What type of surgery is being performed? PROSTATE Bx    2. When is this surgery scheduled? TBD   3. What type of clearance is required (medical clearance vs. Pharmacy clearance to hold med vs. Both)? MEDICAL  4. Are there any medications that need to be held prior to surgery and how long? PLAVIX x 5 DAYS PRIOR and 5 DAYS POST PROCEDURE    5. Practice name and name of physician performing surgery? [facility]; DR. CHAO   6. What is the office phone number? [phone]   7.   What is the office fax number? [phone]  8.   Anesthesia type (None, local, MAC, general) ? NONE LISTED    Adam Gilbert 08/15/2019, 10:21 AM  _________________________________________________________________   (provider comments below)

## 2019-08-18 NOTE — Telephone Encounter (Signed)
OK to hold plavix for 5 days pre and post procedure. Gerald Stabs

## 2019-08-19 NOTE — Telephone Encounter (Signed)
Called pt and scheduled appt 09/20/2019 @11 :45 AM pt will arrive at 1130am w/[name]

## 2019-08-19 NOTE — Telephone Encounter (Signed)
Primary Cardiologist:Christopher Angelena Form, MD  Chart reviewed as part of pre-operative protocol coverage. Because of Adam Gilbert past medical history and time since last visit, he/she will require a follow-up visit in order to better assess preoperative cardiovascular risk.  Pre-op covering staff: - Please schedule appointment and call patient to inform them. - Please contact requesting surgeon's office via preferred method (i.e, phone, fax) to inform them of need for appointment prior to surgery.  If applicable, this message will also be routed to pharmacy pool and/or primary cardiologist for input on holding anticoagulant/antiplatelet agent as requested below so that this information is available at time of patient's appointment.   Deberah Pelton, NP  08/19/2019, 7:12 AM

## 2019-09-02 ENCOUNTER — Other Ambulatory Visit: Payer: Self-pay

## 2019-09-02 ENCOUNTER — Ambulatory Visit (INDEPENDENT_AMBULATORY_CARE_PROVIDER_SITE_OTHER): Payer: Medicare Other | Admitting: Physician Assistant

## 2019-09-02 ENCOUNTER — Encounter: Payer: Self-pay | Admitting: Physician Assistant

## 2019-09-02 VITALS — BP 102/70 | HR 90 | Ht 72.0 in | Wt 188.0 lb

## 2019-09-02 DIAGNOSIS — I2510 Atherosclerotic heart disease of native coronary artery without angina pectoris: Secondary | ICD-10-CM

## 2019-09-02 DIAGNOSIS — Z01810 Encounter for preprocedural cardiovascular examination: Secondary | ICD-10-CM

## 2019-09-02 DIAGNOSIS — E7800 Pure hypercholesterolemia, unspecified: Secondary | ICD-10-CM | POA: Diagnosis not present

## 2019-09-02 DIAGNOSIS — Z720 Tobacco use: Secondary | ICD-10-CM

## 2019-09-02 NOTE — Progress Notes (Signed)
Cardiology Office Note:    Date:  09/02/2019   ID:  Esther Broyles, DOB Mar 25, 1945, MRN [id number]  PCP:  [facility]  [facility] Cardiologist:  Lauree Chandler, MD   [place] Electrophysiologist:  None   Referring MD: [facility]   Chief Complaint:  Surgical Clearance    Patient Profile:    Adam Gilbert is a 74 y.o. male with:     Coronary artery disease   S/p NSTEMI 11/09 >> DES to LAD   ETT 11/18: low risk  Diabetes mellitus   Hyperlipidemia   Peripheral arterial disease   Tobacco abuse  DJD  Prior CV studies: ETT 12/02/16 Low risk, no ECG evidence of ischemia  Echocardiogram 11/12/12 EF 55-60, no RWMA, Gr 1 DD  Cardiac catheterization 11/12/07 LAD mid 95, mid-dist 70 (systolic compression); Dx 50 RCA ok LCx ok EF 60, apical HK PCI: 2.5 x 22 mm Palmaz DES to LAD/Dx bifurcation (jailed Dx - salvage POBA to Dx)  History of Present Illness:    Mr. Georg was last seen by Dr. Angelena Form in 12/2018.  He needs a prostate Bx with Dr. Nila Nephew in [place].  He returns for surgical clearance.  He is here alone.  He has done well since he last saw Dr. Angelena Form.  He has not had chest pain, shortness of breath, syncope, orthopnea, leg swelling.  He rides a recumbent bike 3 times a week for 30 min.  He also does housework and yard work without limitations.    Past Medical History:  Diagnosis Date  . Arthritis   . Chest discomfort   . Colon polyps   . Coronary artery disease    s/p PTCA, DES to the LAD November 2009  . History of chicken pox   . Hyperlipidemia   . Multiple allergies   . OA (osteoarthritis)   . PVD (peripheral vascular disease) with claudication ([place])   . Tobacco abuse     Current Medications: Current Meds  Medication Sig  . aspirin 81 MG tablet Take 81 mg by mouth daily.  Marland Kitchen atorvastatin (LIPITOR) 20 MG tablet Take 1 tablet (20 mg total) daily by mouth.  . clopidogrel (PLAVIX) 75 MG tablet Take 1 tablet (75  mg total) daily by mouth.  . diclofenac sodium (VOLTAREN) 1 % GEL Apply as needed topically (PAIN).  Marland Kitchen empagliflozin (JARDIANCE) 10 MG TABS tablet Take 10 mg by mouth daily.  . metoprolol tartrate (LOPRESSOR) 25 MG tablet Take 0.5 tablets (12.5 mg total) 2 (two) times daily by mouth.  . nitroGLYCERIN (NITROSTAT) 0.4 MG SL tablet Place 1 tablet (0.4 mg total) every 5 (five) minutes as needed under the tongue for chest pain (x 3 pillsdaily).  . tamsulosin (FLOMAX) 0.4 MG CAPS capsule SMARTSIG:1 Capsule(s) By Mouth Every Evening     Allergies:   Patient has no known allergies.   Social History   Tobacco Use  . Smoking status: Current Every Day Smoker    Packs/day: 0.50    Years: 50.00    Pack years: 25.00    Types: Cigarettes    Last attempt to quit: 01/03/1966    Years since quitting: 53.6  . Smokeless tobacco: Never Used  Vaping Use  . Vaping Use: Never used  Substance Use Topics  . Alcohol use: Yes    Alcohol/week: 1.0 standard drink    Types: 1 Standard drinks or equivalent per week  . Drug use: No     Family Hx: The patient's family history includes  Heart attack in his father.  ROS   EKGs/Labs/Other Test Reviewed:    EKG:  EKG is   ordered today.  The ekg ordered today demonstrates normal sinus rhythm, HR 90, normal axis, no ST-TW changes, QTc 447, no change from prior tracing   Recent Labs: No results found for requested labs within last 8760 hours.   Recent Lipid Panel Lab Results  Component Value Date/Time   CHOL 200 09/12/2008 09:35 AM   TRIG 87.0 09/12/2008 09:35 AM   HDL 33.20 (L) 09/12/2008 09:35 AM   CHOLHDL 6 09/12/2008 09:35 AM   LDLCALC 149 (H) 09/12/2008 09:35 AM    Physical Exam:    VS:  BP 102/70   Pulse 90   Ht 6' (1.829 m)   Wt 188 lb (85.3 kg)   SpO2 97%   BMI 25.50 kg/m     Wt Readings from Last 3 Encounters:  09/02/19 188 lb (85.3 kg)  12/20/18 187 lb 9.6 oz (85.1 kg)  11/17/17 207 lb 9.6 oz (94.2 kg)     Constitutional:       Appearance: Healthy appearance. Not in distress.  Neck:     Thyroid: No thyromegaly.     Vascular: No carotid bruit. JVD normal.  Pulmonary:     Effort: Pulmonary effort is normal.     Breath sounds: No wheezing. No rales.  Cardiovascular:     Normal rate. Regular rhythm. Normal S1. Normal S2.     Murmurs: There is no murmur.  Pulses:    Intact distal pulses.  Edema:    Peripheral edema absent.  Abdominal:     Palpations: Abdomen is soft. There is no hepatomegaly.  Skin:    General: Skin is warm and dry.  Neurological:     General: No focal deficit present.     Mental Status: Alert and oriented to person, place and time.     Cranial Nerves: Cranial nerves are intact.       ASSESSMENT & PLAN:    1. Preoperative cardiovascular examination   Mr. Lassalle's perioperative risk of a major cardiac event is 0.9% according to the Revised Cardiac Risk Index (RCRI).  Therefore, he is at low risk for perioperative complications.   His functional capacity is good at 5.62 METs according to the Duke Activity Status Index (DASI). Recommendations: According to ACC/AHA guidelines, no further cardiovascular testing needed.  The patient may proceed to surgery at acceptable risk.   Antiplatelet and/or Anticoagulation Recommendations: Clopidogrel (Plavix) can be held for 5 days prior to his surgery and resumed as soon as possible post op.  The patient stopped ASA and Clopidogrel on his own.  His procedure is in 2 days.  He should resume ASA as soon as possible (when safe) after his procedure.    2. Coronary artery disease involving native coronary artery of native heart without angina pectoris History of non-STEMI in 2009 treated with a DES to the LAD.  Exercise treadmill test in 2018 was low risk.  His ECG does not demonstrate any changes.  He is not having anginal symptoms.  Continue aspirin, clopidogrel, atorvastatin, metoprolol tartrate.  Follow-up with Dr. Angelena Form in December as planned.  3. Pure  hypercholesterolemia Continue statin therapy.  His labs are followed at the [place].  4. Tobacco abuse We discussed the importance of quitting today.    Dispo:  Return in about 4 months (around 01/02/2020) for Routine Follow Up w/ Dr. Angelena Form, in person.   Medication Adjustments/Labs and Tests Ordered:  Current medicines are reviewed at length with the patient today.  Concerns regarding medicines are outlined above.  Tests Ordered: Orders Placed This Encounter  Procedures  . EKG 12-Lead   Medication Changes: No orders of the defined types were placed in this encounter.   Signed, Richardson Dopp, PA-C  09/02/2019 4:01 PM    [facility] [address], [place], [place]  [address] Phone: [phone]; Fax: [phone]

## 2019-09-02 NOTE — Patient Instructions (Addendum)
Medication Instructions:  Your physician recommends that you continue on your current medications as directed. Please refer to the Current Medication list given to you today.  *If you need a refill on your cardiac medications before your next appointment, please call your pharmacy*  Lab Work: None ordered today  Testing/Procedures: None ordered today  Follow-Up: On 01/20/2020 at 3:40PM with Lauree Chandler, MD

## 2019-09-20 ENCOUNTER — Ambulatory Visit: Payer: Medicare Other | Admitting: Physician Assistant

## 2019-10-24 ENCOUNTER — Encounter: Payer: Self-pay | Admitting: Radiation Oncology

## 2019-10-24 NOTE — Progress Notes (Signed)
GU Location of Tumor / Histology: prostatic adenocarcinoma  If Prostate Cancer, Gleason Score is (3 + 4) and PSA is (11.7). Prostate volume: 25.8 mL  Adam Gilbert reports that in September 2020 he was first told he had an elevated PSA by a [place] physician following a routine physical.  Biopsies of prostate (if applicable) revealed:    Past/Anticipated interventions by urology, if any: Prescribed Flomax. Prostate biopsy. CT (negative), referral to Dr. Tammi Klippel.  Past/Anticipated interventions by medical oncology, if any: no  Weight changes, if any: no  Bowel/Bladder complaints, if any: IPSS 16. SHIM 1. Denies dysuria or hematuria. Reports occasional urinary leakage.Reports urinary hesitancy, urgency, and nocturia x 1-2. Denies any bowel complaints.   Nausea/Vomiting, if any: no  Pain issues, if any:  no  SAFETY ISSUES:  Prior radiation? no  Pacemaker/ICD? no  Possible current pregnancy? no, male patient  Is the patient on methotrexate? no  Current Complaints / other details:  74 year old male. Married with two children. Retired. Reports his wife has Alzheimer thus he hasn't been sexually active.

## 2019-10-25 ENCOUNTER — Ambulatory Visit
Admission: RE | Admit: 2019-10-25 | Discharge: 2019-10-25 | Disposition: A | Discharge status: Home / Self Care | Payer: Medicare Other | Source: Ambulatory Visit | Attending: Radiation Oncology | Admitting: Radiation Oncology

## 2019-10-25 ENCOUNTER — Other Ambulatory Visit: Payer: Self-pay

## 2019-10-25 ENCOUNTER — Encounter: Payer: Self-pay | Admitting: Radiation Oncology

## 2019-10-25 VITALS — Ht 72.0 in | Wt 182.0 lb

## 2019-10-25 DIAGNOSIS — C61 Malignant neoplasm of prostate: Secondary | ICD-10-CM | POA: Diagnosis not present

## 2019-10-25 DIAGNOSIS — R972 Elevated prostate specific antigen [PSA]: Secondary | ICD-10-CM | POA: Diagnosis not present

## 2019-10-25 HISTORY — DX: Malignant neoplasm of prostate: C61

## 2019-10-25 NOTE — Progress Notes (Signed)
Radiation Oncology         [phone] ________________________________  Initial Outpatient Consultation - Conducted via Telephone due to current COVID-19 concerns for limiting patient exposure  Name: Adam Gilbert MRN: [id number]  Date: 10/25/2019  DOB: 1945-09-13  YD:XAJOIN, Jessica Priest, MD   REFERRING PHYSICIAN: Joie Bimler, MD  DIAGNOSIS: 74 y.o. gentleman with Stage T1c adenocarcinoma of the prostate with Gleason score of 3+4, and PSA of 11.7.    ICD-10-CM   1. Malignant neoplasm of prostate ([place])  C61     HISTORY OF PRESENT ILLNESS: Adam Gilbert is a 74 y.o. male with a diagnosis of prostate cancer. He ha a history of elevated PSA followed by the [facility], initially noted to be elevated at 7.52 back in 09/2018, which was up from his baseline of 3.11 in 11/2016. More recently, the PSA was further elevated at 11.7 in 06/2019 which prompted further evaluation with a prostate MRI on 07/02/2019 at [facility]. This exam showed a PI-RADS 3 lesion in the left base anterior peripheral zone without evidence of lymphadenopathy or skeletal metastasis.  The prostate volume was estimated at 33 cc.  Accordingly, he was referred for evaluation in urology by Dr. Nila Nephew on 08/13/2019,  digital rectal examination was performed at that time revealing no nodules.  He was started on Flomax at that visit and has noted significant improvement in his LUTS. The patient proceeded to transrectal ultrasound with 11 biopsies of the prostate on 09/04/2019.  The prostate volume measured 25.7 cc by ultrasound.  Out of 11 core biopsies, 8 were positive.  The maximum Gleason score was 3+4, and this was seen in the right apex, and right base (small foci) as well as 5/6 of the left-sided cores with the exception of the left apex lateral. Additionally, Gleason 3+3 was seen in the right mid lateral.  He underwent CT A/P on 09/19/2019 for disease staging and this was without evidence of visceral or skeletal  metastatic disease.  The patient reviewed the biopsy results with his urologist and he has kindly been referred today for discussion of potential radiation treatment options.   PREVIOUS RADIATION THERAPY: No  PAST MEDICAL HISTORY:  Past Medical History:  Diagnosis Date  . Arthritis   . Chest discomfort   . Colon polyps   . Coronary artery disease    s/p PTCA, DES to the LAD November 2009  . History of chicken pox   . Hyperlipidemia   . Multiple allergies   . OA (osteoarthritis)   . Prostate cancer ([place])   . PVD (peripheral vascular disease) with claudication ([place])   . Tobacco abuse       PAST SURGICAL HISTORY: Past Surgical History:  Procedure Laterality Date  . BACK SURGERY  2011   x 2  . CARDIAC CATHETERIZATION  2009   2 DES stents to the LAD  . PROSTATE BIOPSY      FAMILY HISTORY:  Family History  Problem Relation Age of Onset  . Heart attack Father   . Breast cancer Neg Hx   . Prostate cancer Neg Hx   . Colon cancer Neg Hx   . Pancreatic cancer Neg Hx     SOCIAL HISTORY:  Social History   Socioeconomic History  . Marital status: Married    Spouse name: Not on file  . Number of children: 2  . Years of education: Not on file  . Highest education level: Not on file  Occupational History  . Occupation:  Retired  Tobacco Use  . Smoking status: Current Every Day Smoker    Packs/day: 0.50    Years: 50.00    Pack years: 25.00    Types: Cigarettes  . Smokeless tobacco: Never Used  Vaping Use  . Vaping Use: Never used  Substance and Sexual Activity  . Alcohol use: Yes    Alcohol/week: 1.0 standard drink    Types: 1 Standard drinks or equivalent per week  . Drug use: No  . Sexual activity: Not Currently    Comment: wife has alzeheimers  Other Topics Concern  . Not on file  Social History Narrative  . Not on file   Social Determinants of Health   Financial Resource Strain:   . Difficulty of Paying Living Expenses: Not on file  Food Insecurity:    . Worried About Charity fundraiser in the Last Year: Not on file  . Ran Out of Food in the Last Year: Not on file  Transportation Needs:   . Lack of Transportation (Medical): Not on file  . Lack of Transportation (Non-Medical): Not on file  Physical Activity:   . Days of Exercise per Week: Not on file  . Minutes of Exercise per Session: Not on file  Stress:   . Feeling of Stress : Not on file  Social Connections:   . Frequency of Communication with Friends and Family: Not on file  . Frequency of Social Gatherings with Friends and Family: Not on file  . Attends Religious Services: Not on file  . Active Member of Clubs or Organizations: Not on file  . Attends Archivist Meetings: Not on file  . Marital Status: Not on file  Intimate Partner Violence:   . Fear of Current or Ex-Partner: Not on file  . Emotionally Abused: Not on file  . Physically Abused: Not on file  . Sexually Abused: Not on file    ALLERGIES: Patient has no known allergies.  MEDICATIONS:  Current Outpatient Medications  Medication Sig Dispense Refill  . aspirin 81 MG tablet Take 81 mg by mouth daily.    Marland Kitchen atorvastatin (LIPITOR) 20 MG tablet Take 1 tablet (20 mg total) daily by mouth. 90 tablet 3  . clopidogrel (PLAVIX) 75 MG tablet Take 1 tablet (75 mg total) daily by mouth. 90 tablet 3  . diclofenac sodium (VOLTAREN) 1 % GEL Apply as needed topically (PAIN).    Marland Kitchen empagliflozin (JARDIANCE) 10 MG TABS tablet Take 10 mg by mouth daily.    . metoprolol tartrate (LOPRESSOR) 25 MG tablet Take 0.5 tablets (12.5 mg total) 2 (two) times daily by mouth. 90 tablet 3  . tamsulosin (FLOMAX) 0.4 MG CAPS capsule SMARTSIG:1 Capsule(s) By Mouth Every Evening    . nitroGLYCERIN (NITROSTAT) 0.4 MG SL tablet Place 1 tablet (0.4 mg total) every 5 (five) minutes as needed under the tongue for chest pain (x 3 pillsdaily). (Patient not taking: Reported on 10/25/2019) 75 tablet 3   No current facility-administered medications  for this encounter.    REVIEW OF SYSTEMS:  On review of systems, the patient reports that he is doing well overall. He denies any chest pain, shortness of breath, cough, fevers, chills, night sweats, unintended weight changes. He denies any bowel disturbances, and denies abdominal pain, nausea or vomiting. He denies any new musculoskeletal or joint aches or pains. His IPSS was 16, indicating moderate urinary symptoms. He reports occasional small volume urinary leakage, as well as urinary hesitancy, frequency, urgency, and nocturia x1-2. He  denies dysuria, gross hematuria, straining to void or incomplete bladder emptying. His SHIM was 1, indicating he has severe erectile dysfunction. He notes, however, that he is not currently sexually active. A complete review of systems is obtained and is otherwise negative.    PHYSICAL EXAM:  Wt Readings from Last 3 Encounters:  10/25/19 182 lb (82.6 kg)  09/02/19 188 lb (85.3 kg)  12/20/18 187 lb 9.6 oz (85.1 kg)   Temp Readings from Last 3 Encounters:  No data found for Temp   BP Readings from Last 3 Encounters:  09/02/19 102/70  12/20/18 126/86  11/17/17 114/80   Pulse Readings from Last 3 Encounters:  09/02/19 90  12/20/18 88  11/17/17 79   Pain Assessment Pain Score: 0-No pain/10  Physical exam not performed in light of telephone consult visit format.   KPS = 90  100 - Normal; no complaints; no evidence of disease. 90   - Able to carry on normal activity; minor signs or symptoms of disease. 80   - Normal activity with effort; some signs or symptoms of disease. 29   - Cares for self; unable to carry on normal activity or to do active work. 60   - Requires occasional assistance, but is able to care for most of his personal needs. 50   - Requires considerable assistance and frequent medical care. 47   - Disabled; requires special care and assistance. 73   - Severely disabled; hospital admission is indicated although death not imminent. 37    - Very sick; hospital admission necessary; active supportive treatment necessary. 10   - Moribund; fatal processes progressing rapidly. 0     - Dead  Karnofsky DA, Abelmann Seligman, Craver LS and Burchenal [facility] [phone]) The use of the nitrogen mustards in the palliative treatment of carcinoma: with particular reference to bronchogenic carcinoma Cancer 1 [phone]  LABORATORY DATA:  Lab Results  Component Value Date   WBC 9.2 06/05/2009   HGB 10.5 (L) 06/05/2009   HCT 30.6 (L) 06/05/2009   MCV 95.2 06/05/2009   PLT 185 06/05/2009   Lab Results  Component Value Date   NA 135 06/03/2009   K 3.8 06/03/2009   CL 100 06/03/2009   CO2 26 06/03/2009   Lab Results  Component Value Date   ALT 16 09/12/2008   AST 20 09/12/2008   ALKPHOS 58 09/12/2008   BILITOT 0.9 09/12/2008     RADIOGRAPHY: No results found.    IMPRESSION/PLAN: This visit was conducted via Telephone to spare the patient unnecessary potential exposure in the healthcare setting during the current COVID-19 pandemic. 1. 74 y.o. gentleman with Stage T1c adenocarcinoma of the prostate with Gleason Score of 3+4, and PSA of 11.7. We discussed the patient's workup and outlined the nature of prostate cancer in this setting. The patient's T stage, Gleason's score, and PSA put him into the favorable intermediate risk group. Accordingly, he is eligible for a variety of potential treatment options including brachytherapy, 5.5 weeks of external radiation, or prostatectomy. We discussed the available radiation techniques, and focused on the details and logistics of delivery. We discussed and outlined the risks, benefits, short and long-term effects associated with radiotherapy and compared and contrasted these with prostatectomy. We discussed the role of SpaceOAR in reducing the rectal toxicity associated with radiotherapy. He appears to have a good understanding of his disease and our treatment recommendations which are of curative intent.  He was  encouraged to ask questions that were answered to his  stated satisfaction.  At the end of the conversation, the patient is interested in moving forward with brachytherapy and use of SpaceOAR gel to reduce rectal toxicity from radiotherapy.  We will share our discussion with Dr. Nila Nephew and move forward with scheduling his CT [facility] planning appointment in the near future.  The patient will be contacted by Romie Jumper in our office who will be working closely with him to coordinate OR scheduling and pre and post procedure appointments.  We will contact the pharmaceutical rep to ensure that [place] is available at the time of procedure.  We enjoyed meeting him today and look forward to continuing to participate in his care.  Given current concerns for patient exposure during the COVID-19 pandemic, this encounter was conducted via telephone. The patient was notified in advance and was offered a MyChart meeting to allow for face to face communication but unfortunately reported that he did not have the appropriate resources/technology to support such a visit and instead preferred to proceed with telephone consult. The patient has given verbal consent for this type of encounter. The time spent during this encounter was 60 minutes. The attendants for this meeting include Tyler Pita MD, Ashlyn Bruning PA-C, [place], and patient, Masami Plata and his family. During the encounter, Tyler Pita MD, Ashlyn Bruning PA-C, and scribe, Wilburn Mylar were located at [place].  Patient, Brannon Levene and his family were located at home.    Nicholos Johns, PA-C    Tyler Pita, MD  [facility] Direct Dial: [phone]  Fax: [phone] [email or web address]  Skype  LinkedIn  This document serves as a record of services personally performed by Tyler Pita, MD and Freeman Caldron, PA-C. It was created on their  behalf by Wilburn Mylar, a trained medical scribe. The creation of this record is based on the scribe's personal observations and the provider's statements to them. This document has been checked and approved by the attending provider.

## 2019-10-30 ENCOUNTER — Telehealth: Payer: Self-pay | Admitting: *Deleted

## 2019-10-30 NOTE — Telephone Encounter (Signed)
Called patient to ask questions, lvm for a return call

## 2019-11-01 ENCOUNTER — Encounter: Payer: Self-pay | Admitting: Medical Oncology

## 2019-11-01 NOTE — Progress Notes (Signed)
Spoke with patient to introduce myself as the prostate nurse navigator and discuss my role. I was unable to meet him 10/22, when he consulted with Dr. Tammi Klippel. He has chosen brachytherapy to treat his prostate cancer. No barriers to care identified at this time. He is aware Enid Derry will contact him with appointments for CT simulation and surgery. I gave him my contact information and asked him to call me with questions or concerns. He voiced understanding.

## 2019-11-06 ENCOUNTER — Telehealth: Payer: Self-pay | Admitting: *Deleted

## 2019-11-06 NOTE — Telephone Encounter (Signed)
CALLED PATIENT TO ASK QUESTIONS, SPOKE WITH PATIENT 

## 2019-11-07 ENCOUNTER — Telehealth: Payer: Self-pay | Admitting: *Deleted

## 2019-11-07 NOTE — Telephone Encounter (Signed)
CALLED PATIENT TO INFORM OF PRE-SEED APPTS. FOR 12-19-19, SPOKE WITH PATIENT AND HE IS AWARE OF THESE APPTS.

## 2019-11-12 ENCOUNTER — Telehealth: Payer: Self-pay | Admitting: *Deleted

## 2019-11-12 NOTE — Telephone Encounter (Signed)
CALLED PATIENT TO INFORM OF IMPLANT DATE OF 01/16/20, SPOKE WITH PATIENT AND HE IS AWARE OF THIS IMPLANT DATE

## 2019-12-18 ENCOUNTER — Telehealth: Payer: Self-pay | Admitting: *Deleted

## 2019-12-18 NOTE — Telephone Encounter (Signed)
CALLED PATIENT TO REMIND OF PRE-SEED APPTS. FOR 12-19-19, LVM FOR A RETURN CALL

## 2019-12-19 ENCOUNTER — Encounter (HOSPITAL_COMMUNITY)
Admission: RE | Admit: 2019-12-19 | Discharge: 2019-12-19 | Disposition: A | Discharge status: Home / Self Care | Payer: Medicare Other | Source: Ambulatory Visit | Attending: Urology | Admitting: Urology

## 2019-12-19 ENCOUNTER — Other Ambulatory Visit: Payer: Self-pay

## 2019-12-19 ENCOUNTER — Ambulatory Visit
Admission: RE | Admit: 2019-12-19 | Discharge: 2019-12-19 | Disposition: A | Discharge status: Home / Self Care | Payer: Medicare Other | Source: Ambulatory Visit | Attending: Urology | Admitting: Urology

## 2019-12-19 ENCOUNTER — Ambulatory Visit
Admission: RE | Admit: 2019-12-19 | Discharge: 2019-12-19 | Disposition: A | Discharge status: Home / Self Care | Payer: Medicare Other | Source: Ambulatory Visit | Attending: Radiation Oncology | Admitting: Radiation Oncology

## 2019-12-19 ENCOUNTER — Ambulatory Visit (HOSPITAL_COMMUNITY)
Admission: RE | Admit: 2019-12-19 | Discharge: 2019-12-19 | Disposition: A | Discharge status: Home / Self Care | Payer: Medicare Other | Source: Ambulatory Visit | Attending: Urology | Admitting: Urology

## 2019-12-19 DIAGNOSIS — Z01818 Encounter for other preprocedural examination: Secondary | ICD-10-CM | POA: Diagnosis not present

## 2019-12-19 DIAGNOSIS — C61 Malignant neoplasm of prostate: Secondary | ICD-10-CM

## 2019-12-19 DIAGNOSIS — Z510 Encounter for antineoplastic radiation therapy: Secondary | ICD-10-CM | POA: Diagnosis not present

## 2019-12-19 NOTE — Progress Notes (Signed)
  Radiation Oncology         [phone] ________________________________  Name: [name] MRN: [id number]  Date: 12/19/2019  DOB: [birth year]  SIMULATION AND TREATMENT PLANNING NOTE PUBIC ARCH STUDY  CV:ELFYBO, Jessica Priest, MD  DIAGNOSIS:  Oncology History  Malignant neoplasm of prostate ([place])  09/04/2019 Cancer Staging   Staging form: Prostate, AJCC 8th Edition - Clinical stage from 09/04/2019: Stage IIB (cT1c, cN0, cM0, PSA: 11.7, Grade Group: 2) - Signed by Freeman Caldron, PA-C on 10/25/2019   10/25/2019 Initial Diagnosis   Malignant neoplasm of prostate ([place])       ICD-10-CM   1. Malignant neoplasm of prostate ([place])  C61     COMPLEX SIMULATION:  The patient presented today for evaluation for possible prostate seed implant. He was brought to the radiation planning suite and placed supine on the CT couch. A 3-dimensional image study set was obtained in upload to the planning computer. There, on each axial slice, I contoured the prostate gland. Then, using three-dimensional radiation planning tools I reconstructed the prostate in view of the structures from the transperineal needle pathway to assess for possible pubic arch interference. In doing so, I did not appreciate any pubic arch interference. Also, the patient's prostate volume was estimated based on the drawn structure. The volume was 34 cc.  Given the pubic arch appearance and prostate volume, patient remains a good candidate to proceed with prostate seed implant. Today, he freely provided informed written consent to proceed.    PLAN: The patient will undergo prostate seed implant.   ________________________________  Sheral Apley. Tammi Klippel, M.D.

## 2020-01-13 ENCOUNTER — Other Ambulatory Visit (HOSPITAL_COMMUNITY)
Admission: RE | Admit: 2020-01-13 | Discharge: 2020-01-13 | Disposition: A | Discharge status: Home / Self Care | Payer: No Typology Code available for payment source | Source: Ambulatory Visit | Attending: Urology | Admitting: Urology

## 2020-01-13 ENCOUNTER — Encounter (HOSPITAL_COMMUNITY)
Admission: RE | Admit: 2020-01-13 | Discharge: 2020-01-13 | Disposition: A | Discharge status: Home / Self Care | Payer: No Typology Code available for payment source | Source: Ambulatory Visit | Attending: Urology | Admitting: Urology

## 2020-01-13 ENCOUNTER — Encounter (HOSPITAL_BASED_OUTPATIENT_CLINIC_OR_DEPARTMENT_OTHER): Payer: Self-pay | Admitting: Urology

## 2020-01-13 ENCOUNTER — Other Ambulatory Visit: Payer: Self-pay

## 2020-01-13 DIAGNOSIS — Z20822 Contact with and (suspected) exposure to covid-19: Secondary | ICD-10-CM | POA: Diagnosis not present

## 2020-01-13 DIAGNOSIS — Z01812 Encounter for preprocedural laboratory examination: Secondary | ICD-10-CM | POA: Insufficient documentation

## 2020-01-13 LAB — COMPREHENSIVE METABOLIC PANEL
ALT: 15 U/L (ref 0–44)
AST: 20 U/L (ref 15–41)
Albumin: 4.5 g/dL (ref 3.5–5.0)
Alkaline Phosphatase: 69 U/L (ref 38–126)
Anion gap: 12 (ref 5–15)
BUN: 22 mg/dL (ref 8–23)
CO2: 23 mmol/L (ref 22–32)
Calcium: 9.2 mg/dL (ref 8.9–10.3)
Chloride: 105 mmol/L (ref 98–111)
Creatinine, Ser: 0.89 mg/dL (ref 0.61–1.24)
GFR, Estimated: >60 mL/min (ref >60)
Glucose, Bld: 108 mg/dL — ABNORMAL HIGH (ref 70–99)
Potassium: 4.7 mmol/L (ref 3.5–5.1)
Sodium: 140 mmol/L (ref 135–145)
Total Bilirubin: 0.8 mg/dL (ref 0.3–1.2)
Total Protein: 7.5 g/dL (ref 6.5–8.1)

## 2020-01-13 LAB — PROTIME-INR
INR: 1.1 (ref 0.8–1.2)
Prothrombin Time: 13.5 seconds (ref 11.4–15.2)

## 2020-01-13 LAB — CBC
HCT: 51.1 % (ref 39.0–52.0)
Hemoglobin: 17.1 g/dL — ABNORMAL HIGH (ref 13.0–17.0)
MCH: 31.5 pg (ref 26.0–34.0)
MCHC: 33.5 g/dL (ref 30.0–36.0)
MCV: 94.3 fL (ref 80.0–100.0)
Platelets: 222 10*3/uL (ref 150–400)
RBC: 5.42 MIL/uL (ref 4.22–5.81)
RDW: 13.6 % (ref 11.5–15.5)
WBC: 7 10*3/uL (ref 4.0–10.5)
nRBC: 0 % (ref 0.0–0.2)

## 2020-01-13 LAB — SARS CORONAVIRUS 2 (TAT 6-24 HRS): SARS Coronavirus 2: NEGATIVE

## 2020-01-13 LAB — APTT: aPTT: 33 seconds (ref 24–36)

## 2020-01-13 NOTE — Progress Notes (Signed)
Spoke w/ via phone for pre-op interview---pt  Lab needs dos----   none            COVID test ------01-13-2020 at 1030 Arrive at -------1200 pm 01-16-2020 NPO after MN NO Solid Food.  Clear liquids from MN until---1100 am then npo Medications to take morning of surgery -----atorvastatin, metoprolol tartrate Diabetic medication -----none day of surgery Patient Special Instructions -----fleets enema am of surgery Pre-Op special Istructions -----none Patient verbalized understanding of instructions that were given at this phone interview. Patient denies shortness of breath, chest pain, fever, cough at this phone interview.  Anesthesia Review:hx of stent to dea 2009, cad, pvd, dm type 2, pvd, sob with heavy exertion  PJA:[id number] va Cardiologist :cardiac clearance scott weaver pa 09-02-2019 lov dr Angelena Form 12-20-2018 epic Chest x-ray :12-19-2019 epic EKG :09-02-2019 epic Echo :11-13-18-14 epic Stress test:ett 12-02-2016 epic Cardiac Cath : 2009 Activity level: does own housework without problems  Sleep Study/ CPAP :none Fasting Blood Sugar :140      / Checks Blood Sugar -- 2 x week Blood Thinner/ Instructions /Last Dose: stops plavis x 5 days per scott weaver instructions ASA / Instructions/ Last Dose :pt  stopped x 5 days per scott weaver pa instructions 09-02-2019 epic

## 2020-01-15 ENCOUNTER — Ambulatory Visit: Payer: Self-pay | Admitting: Urology

## 2020-01-15 ENCOUNTER — Telehealth: Payer: Self-pay | Admitting: *Deleted

## 2020-01-15 NOTE — Telephone Encounter (Signed)
CALLED PATIENT TO REMIND OF PROCEDURE FOR 01-16-20, SPOKE WITH PATIENT AND HE IS AWARE OF THIS PROCEDURE.

## 2020-01-15 NOTE — H&P (Signed)
NAMEIZEK, CORVINO MEDICAL RECORD YW:[id number] ACCOUNT [id number] DATE OF BIRTH:1945/03/06 FACILITY: [facility] LOCATION: [facility] PHYSICIAN:Brisia Schuermann, MD  HISTORY AND PHYSICAL  DATE OF ADMISSION:  01/13/2020  This is for procedure on 01/16/2020.    HISTORY OF PRESENT ILLNESS:  The patient is a 75 year old white male with a recent diagnosis of prostate cancer.  He had an elevated PSA up to 11.7 and underwent prostate biopsy in September of 2021.  On 11 core biopsies, 8 were positive.  Maximum  Gleason score was 7.  He had 5 positive biopsies on the left side of his prostate and 3 on the right.  He underwent staging CT and this did not reveal any metastatic disease.  The patient was presented his options for therapy and he has opted for  radioactive seed implantation.  PAST MEDICAL HISTORY:  Significant for arthritis, coronary artery disease.  He is status post PTCA in November of 2009 with stent placement.  He has a history of hyperlipidemia and peripheral vascular disease.  PAST SURGICAL HISTORY:  He has had previous back surgeries x2 in 2011.  FAMILY HISTORY:  Significant for coronary artery disease.  SOCIAL HISTORY:  He does have a history of smoking.  He smokes about half a pack a day.  He has smoked for about 25 years.  Alcohol intake is about 1 drink per week.  CURRENT MEDICATIONS:  Include aspirin, Lipitor, Plavix, Voltaren gel, Jardiance, Lopressor 25 mg, Flomax and nitroglycerin as needed.  REVIEW OF SYSTEMS:  Show at this time that no chest pain, shortness of breath, cough or fevers.  Bowel movements are regular.  He does have some arthritic joint pains.  He does have some frequency of urination and nocturia one to two times.  ALLERGIES:  No known drug allergies.  PHYSICAL EXAMINATION: GENERAL:  Well-developed, well-nourished man in no acute distress. HEENT:  Normocephalic and atraumatic.  The eyes showed the pupils are equally round and reactive to light. NECK:  Supple,  without masses. LUNGS:  Clear to auscultation. HEART:  Sounds are regular rate and rhythm. ABDOMEN:  Nontender. BACK:  Without CVA tenderness. GENITOURINARY:  The penis is without lesions.  Both testes descended.  Rectal reveals normal sphincter tone.  Approximately 25 gram prostate, smooth and nontender. EXTREMITIES:  Lower extremities are without edema.  Normal reflexes noted.  LABORATORY DATA:  Pertinent laboratories are pending at this time.  His last PSA done on 01/10/2020 is 12.7.  IMPRESSION:  Stage T1c adenocarcinoma of the prostate, Gleason score 7 with a PSA of 12.7.  Options have been discussed in detail.  He has opted for definitive therapy.  PLAN:  To proceed with cystoscopy and ultrasound of the prostate and radioactive seed implantation for definitive therapy of his prostate cancer.  The risks and benefits have been discussed.  Questions have been answered and patient verbalizes  understanding.  We will proceed with the implant.  HN/NUANCE  D:01/14/2020 T:01/15/2020 JOB:[id number]

## 2020-01-15 NOTE — H&P (Deleted)
  The note originally documented on this encounter has been moved the the encounter in which it belongs.  

## 2020-01-16 ENCOUNTER — Ambulatory Visit (HOSPITAL_BASED_OUTPATIENT_CLINIC_OR_DEPARTMENT_OTHER)
Admission: RE | Admit: 2020-01-16 | Discharge: 2020-01-16 | Disposition: A | Discharge status: Home / Self Care | Payer: No Typology Code available for payment source | Source: Ambulatory Visit | Attending: Urology | Admitting: Urology

## 2020-01-16 ENCOUNTER — Encounter (HOSPITAL_BASED_OUTPATIENT_CLINIC_OR_DEPARTMENT_OTHER): Payer: Self-pay | Admitting: Urology

## 2020-01-16 ENCOUNTER — Ambulatory Visit (HOSPITAL_BASED_OUTPATIENT_CLINIC_OR_DEPARTMENT_OTHER): Payer: No Typology Code available for payment source | Admitting: Anesthesiology

## 2020-01-16 ENCOUNTER — Other Ambulatory Visit: Payer: Self-pay

## 2020-01-16 ENCOUNTER — Encounter (HOSPITAL_BASED_OUTPATIENT_CLINIC_OR_DEPARTMENT_OTHER)
Admission: RE | Disposition: A | Discharge status: Home / Self Care | Payer: Self-pay | Source: Ambulatory Visit | Attending: Urology

## 2020-01-16 ENCOUNTER — Ambulatory Visit (HOSPITAL_COMMUNITY): Payer: No Typology Code available for payment source

## 2020-01-16 DIAGNOSIS — Z79899 Other long term (current) drug therapy: Secondary | ICD-10-CM | POA: Diagnosis not present

## 2020-01-16 DIAGNOSIS — Z87891 Personal history of nicotine dependence: Secondary | ICD-10-CM | POA: Diagnosis not present

## 2020-01-16 DIAGNOSIS — I2510 Atherosclerotic heart disease of native coronary artery without angina pectoris: Secondary | ICD-10-CM | POA: Insufficient documentation

## 2020-01-16 DIAGNOSIS — C61 Malignant neoplasm of prostate: Secondary | ICD-10-CM | POA: Insufficient documentation

## 2020-01-16 HISTORY — DX: Presence of spectacles and contact lenses: Z97.3

## 2020-01-16 HISTORY — PX: RADIOACTIVE SEED IMPLANT: SHX5150

## 2020-01-16 HISTORY — DX: Type 2 diabetes mellitus without complications: E11.9

## 2020-01-16 HISTORY — DX: Presence of external hearing-aid: Z97.4

## 2020-01-16 HISTORY — DX: Presence of dental prosthetic device (complete) (partial): Z97.2

## 2020-01-16 HISTORY — DX: Dyspnea, unspecified: R06.00

## 2020-01-16 LAB — GLUCOSE, CAPILLARY
Glucose-Capillary: 101 mg/dL — ABNORMAL HIGH (ref 70–99)
Glucose-Capillary: 110 mg/dL — ABNORMAL HIGH (ref 70–99)

## 2020-01-16 SURGERY — INSERTION, RADIATION SOURCE, PROSTATE
Anesthesia: General

## 2020-01-16 MED ORDER — ACETAMINOPHEN 500 MG PO TABS
ORAL_TABLET | ORAL | Status: AC
  Filled 2020-01-16: qty 2

## 2020-01-16 MED ORDER — FENTANYL CITRATE (PF) 250 MCG/5ML IJ SOLN
INTRAMUSCULAR | Status: AC
  Filled 2020-01-16: qty 5

## 2020-01-16 MED ORDER — LACTATED RINGERS IV SOLN: INTRAVENOUS | Status: DC

## 2020-01-16 MED ORDER — STERILE WATER FOR IRRIGATION IR SOLN
Status: DC | PRN
Start: 2020-01-16 — End: 2020-01-16
  Administered 2020-01-16: 3 mL via INTRAVESICAL

## 2020-01-16 MED ORDER — EPHEDRINE SULFATE 50 MG/ML IJ SOLN
INTRAMUSCULAR | Status: DC | PRN
  Administered 2020-01-16 (×2): 20 mg via INTRAVENOUS
  Administered 2020-01-16: 10 mg via INTRAVENOUS

## 2020-01-16 MED ORDER — FLEET ENEMA 7-19 GM/118ML RE ENEM: 1.0000 | ENEMA | Freq: Once | RECTAL | Status: DC

## 2020-01-16 MED ORDER — SODIUM CHLORIDE 0.9 % IV SOLN
INTRAVENOUS | Status: AC | PRN
  Administered 2020-01-16: 1000 mL via INTRAMUSCULAR

## 2020-01-16 MED ORDER — GLYCOPYRROLATE 0.2 MG/ML IJ SOLN
INTRAMUSCULAR | Status: DC | PRN
  Administered 2020-01-16: .2 mg via INTRAVENOUS

## 2020-01-16 MED ORDER — BELLADONNA ALKALOIDS-OPIUM 16.2-60 MG RE SUPP
RECTAL | Status: DC | PRN
  Administered 2020-01-16: 1 via RECTAL

## 2020-01-16 MED ORDER — HYDROMORPHONE HCL 1 MG/ML IJ SOLN: 0.2500 mg | INTRAMUSCULAR | Status: DC | PRN

## 2020-01-16 MED ORDER — EPHEDRINE 5 MG/ML INJ
INTRAVENOUS | Status: AC
  Filled 2020-01-16: qty 10

## 2020-01-16 MED ORDER — PHENYLEPHRINE 40 MCG/ML (10ML) SYRINGE FOR IV PUSH (FOR BLOOD PRESSURE SUPPORT)
PREFILLED_SYRINGE | INTRAVENOUS | Status: AC
  Filled 2020-01-16: qty 10

## 2020-01-16 MED ORDER — FENTANYL CITRATE (PF) 100 MCG/2ML IJ SOLN
INTRAMUSCULAR | Status: DC | PRN
  Administered 2020-01-16 (×2): 25 ug via INTRAVENOUS
  Administered 2020-01-16 (×2): 50 ug via INTRAVENOUS

## 2020-01-16 MED ORDER — OXYCODONE HCL 5 MG PO TABS: 5.0000 mg | ORAL_TABLET | Freq: Once | ORAL | Status: DC | PRN

## 2020-01-16 MED ORDER — IOHEXOL 300 MG/ML  SOLN
INTRAMUSCULAR | Status: DC | PRN
  Administered 2020-01-16: 7 mL

## 2020-01-16 MED ORDER — ONDANSETRON HCL 4 MG/2ML IJ SOLN: 4.0000 mg | Freq: Once | INTRAMUSCULAR | Status: DC | PRN

## 2020-01-16 MED ORDER — PHENYLEPHRINE HCL (PRESSORS) 10 MG/ML IV SOLN
INTRAVENOUS | Status: DC | PRN
  Administered 2020-01-16 (×5): 80 ug via INTRAVENOUS

## 2020-01-16 MED ORDER — BELLADONNA ALKALOIDS-OPIUM 16.2-60 MG RE SUPP
RECTAL | Status: AC
  Filled 2020-01-16: qty 1

## 2020-01-16 MED ORDER — ACETAMINOPHEN 500 MG PO TABS
1000.0000 mg | ORAL_TABLET | Freq: Once | ORAL | Status: AC
  Administered 2020-01-16: 1000 mg via ORAL

## 2020-01-16 MED ORDER — OXYCODONE HCL 5 MG/5ML PO SOLN: 5.0000 mg | Freq: Once | ORAL | Status: DC | PRN

## 2020-01-16 MED ORDER — LIDOCAINE HCL (CARDIAC) PF 100 MG/5ML IV SOSY
PREFILLED_SYRINGE | INTRAVENOUS | Status: DC | PRN
  Administered 2020-01-16: 100 mg via INTRAVENOUS

## 2020-01-16 MED ORDER — CIPROFLOXACIN IN D5W 400 MG/200ML IV SOLN
400.0000 mg | INTRAVENOUS | Status: AC
  Administered 2020-01-16: 400 mg via INTRAVENOUS

## 2020-01-16 MED ORDER — ONDANSETRON HCL 4 MG/2ML IJ SOLN
INTRAMUSCULAR | Status: DC | PRN
  Administered 2020-01-16: 4 mg via INTRAVENOUS

## 2020-01-16 MED ORDER — PROPOFOL 10 MG/ML IV BOLUS
INTRAVENOUS | Status: DC | PRN
  Administered 2020-01-16 (×2): 50 mg via INTRAVENOUS
  Administered 2020-01-16: 200 mg via INTRAVENOUS

## 2020-01-16 MED ORDER — GLYCOPYRROLATE PF 0.2 MG/ML IJ SOSY
PREFILLED_SYRINGE | INTRAMUSCULAR | Status: AC
  Filled 2020-01-16: qty 1

## 2020-01-16 MED ORDER — DEXAMETHASONE SODIUM PHOSPHATE 4 MG/ML IJ SOLN
INTRAMUSCULAR | Status: DC | PRN
  Administered 2020-01-16: 5 mg via INTRAVENOUS

## 2020-01-16 MED ORDER — CIPROFLOXACIN IN D5W 400 MG/200ML IV SOLN
INTRAVENOUS | Status: AC
  Filled 2020-01-16: qty 200

## 2020-01-16 SURGICAL SUPPLY — 32 items
AGX100 ×2 IMPLANT
BLADE CLIPPER SENSICLIP SURGIC (BLADE) ×2 IMPLANT
CATH FOLEY 2WAY SLVR  5CC 16FR (CATHETERS) ×2
CATH FOLEY 2WAY SLVR 5CC 16FR (CATHETERS) ×2 IMPLANT
CATH ROBINSON RED A/P 20FR (CATHETERS) ×2 IMPLANT
CLOTH BEACON ORANGE TIMEOUT ST (SAFETY) ×2 IMPLANT
CNTNR URN SCR LID CUP LEK RST (MISCELLANEOUS) ×2 IMPLANT
CONT SPEC 4OZ STRL OR WHT (MISCELLANEOUS) ×4
COVER BACK TABLE 60X90IN (DRAPES) ×2 IMPLANT
COVER MAYO STAND STRL (DRAPES) ×2 IMPLANT
DRAPE C-ARM 35X43 STRL (DRAPES) IMPLANT
DRSG TEGADERM 4X4.75 (GAUZE/BANDAGES/DRESSINGS) ×2 IMPLANT
DRSG TEGADERM 8X12 (GAUZE/BANDAGES/DRESSINGS) ×2 IMPLANT
GAUZE SPONGE 4X4 3PLY NS LF (GAUZE/BANDAGES/DRESSINGS) ×2 IMPLANT
GLOVE BIO SURGEON STRL SZ7.5 (GLOVE) ×4 IMPLANT
GLOVE BIO SURGEON STRL SZ8 (GLOVE) IMPLANT
GLOVE SURG ENC MOIS LTX SZ6.5 (GLOVE) ×2 IMPLANT
GLOVE SURG ORTHO 8.5 STRL (GLOVE) ×10 IMPLANT
GLOVE SURG SS PI 6.5 STRL IVOR (GLOVE) IMPLANT
GOWN STRL REUS W/ TWL LRG LVL3 (GOWN DISPOSABLE) ×1 IMPLANT
GOWN STRL REUS W/TWL LRG LVL3 (GOWN DISPOSABLE) ×2
HOLDER FOLEY CATH W/STRAP (MISCELLANEOUS) ×2 IMPLANT
IV NS 1000ML (IV SOLUTION) ×2
IV NS 1000ML BAXH (IV SOLUTION) ×1 IMPLANT
KIT TURNOVER CYSTO (KITS) ×2 IMPLANT
MARKER SKIN DUAL TIP RULER LAB (MISCELLANEOUS) ×2 IMPLANT
PACK CYSTO (CUSTOM PROCEDURE TRAY) ×2 IMPLANT
SUT BONE WAX W31G (SUTURE) IMPLANT
SYR 10ML LL (SYRINGE) ×2 IMPLANT
TOWEL OR 17X26 10 PK STRL BLUE (TOWEL DISPOSABLE) ×2 IMPLANT
UNDERPAD 30X36 HEAVY ABSORB (UNDERPADS AND DIAPERS) ×4 IMPLANT
WATER STERILE IRR 500ML POUR (IV SOLUTION) ×2 IMPLANT

## 2020-01-16 NOTE — Anesthesia Preprocedure Evaluation (Addendum)
Anesthesia Evaluation  Patient identified by MRN, date of birth, ID band Patient awake    Reviewed: Allergy & Precautions, NPO status , Patient's Chart, lab work & pertinent test results  Airway Mallampati: II  TM Distance: >3 FB Neck ROM: Full    Dental  (+) Edentulous Upper, Partial Lower   Pulmonary Current Smoker,  25 pack year history   Pulmonary exam normal breath sounds clear to auscultation       Cardiovascular + CAD, + Cardiac Stents (DES to LAD 2009, on plavix), + Peripheral Vascular Disease and +CHF (grade 1 diastolic dysfunction)  Normal cardiovascular exam Rhythm:Regular Rate:Normal  Last echo 2014: - Left ventricle: The cavity size was normal. Wall thickness  was normal. Systolic function was normal. The estimated  ejection fraction was in the range of 55% to 60%. Wall  motion was normal; there were no regional wall motion  abnormalities. Doppler parameters are consistent with  abnormal left ventricular relaxation (grade 1 diastolic  dysfunction).  - Mitral valve: Calcified annulus.     Neuro/Psych negative neurological ROS  negative psych ROS   GI/Hepatic negative GI ROS, Neg liver ROS,   Endo/Other  diabetes, Well Controlled, Type 2, Oral Hypoglycemic Agents  Renal/GU negative Renal ROS   Prostate ca     Musculoskeletal  (+) Arthritis , Osteoarthritis,    Abdominal   Peds  Hematology negative hematology ROS (+) hct 51   Anesthesia Other Findings   Reproductive/Obstetrics negative OB ROS                            Anesthesia Physical Anesthesia Plan  ASA: II  Anesthesia Plan: General   Post-op Pain Management:    Induction: Intravenous  PONV Risk Score and Plan: 1 and Ondansetron, Dexamethasone and Treatment may vary due to age or medical condition  Airway Management Planned: LMA  Additional Equipment: None  Intra-op Plan:   Post-operative  Plan: Extubation in OR  Informed Consent: I have reviewed the patients History and Physical, chart, labs and discussed the procedure including the risks, benefits and alternatives for the proposed anesthesia with the patient or authorized representative who has indicated his/her understanding and acceptance.     Dental advisory given  Plan Discussed with: CRNA  Anesthesia Plan Comments:        Anesthesia Quick Evaluation

## 2020-01-16 NOTE — Op Note (Signed)
[name], Adam Gilbert MEDICAL RECORD [id number] ACCOUNT [id number] DATE OF BIRTH:31-Oct-1945 FACILITY: [facility] LOCATION: [facility] PHYSICIAN:Hallel Denherder, MD  OPERATIVE REPORT  DATE OF PROCEDURE:  01/16/2020  PREOPERATIVE DIAGNOSIS:  Stage T2c adenocarcinoma of the prostate.  POSTOPERATIVE DIAGNOSIS:  Stage T2c adenocarcinoma of the prostate.  PROCEDURE:  Transrectal ultrasound of the prostate, intraprostatic seed implantation of I-125 seeds, and cystoscopy.  SURGEON:  Joie Bimler, MD  CO-SURGEON:  Dr. Tyler Pita.  ANESTHESIA USED:  General LMA.  INDICATIONS FOR PROCEDURE:  The patient is a 75 year old male with a diagnosis of prostate cancer involving both the right and left lobes of the prostate.  He was presented his options and has opted for definitive therapy with radioactive seed  implantation.  He presents now for the procedure.  DESCRIPTION OF PROCEDURE:  The patient was brought into the operating room and placed on the table in the supine position.  After adequate general anesthesia was achieved, a Foley catheter was placed and then the patient was carefully placed in  exaggerated lithotomy with the Yellofin stirrups.  The scrotum was then elevated and taped onto the abdomen.  The perineum was shaved and prepped and draped for the performance of the procedure.  A red rubber catheter was then placed into the rectum to  relieve excess air.  The transrectal ultrasound probe was then placed, and prostatic anchors were placed.  The prostate was then scanned from apex to seminal vesicles with good visualization.  The images were scanned into the Nucletron program.  The  individual images were then reviewed by myself and Dr. Tammi Klippel and the physicist.  The borders of the prostate, urethra, and rectum were marked.  The computer program then calculated appropriate seed placement.  All of these individual images were then  reviewed by myself, the physicist, and Dr. Valere Dross to  ascertain seed placement.  Once we had good coverage of the prostate with sparing of the urethra and rectum, seed implantation was begun.  The bladder neck was identified with contrast in the Foley  balloon.  Needles were then placed from anterior to posterior on the prostate to encompass the prostate in its entirety.  No portion of the prostate was left untreated.  Once the final seeds were placed, the anchor needles were removed, and pelvic x-ray  was obtained showing good distribution of the seeds.  Foley catheter was then removed and the patient was reprepped and draped.  Flexible cystoscopy was performed, which showed no seeds within the bladder.  The patient then had a new 16-French Foley  placed to drain the bladder.  A rectal exam was then performed, and this did not reveal any foreign bodies in the rectum.  A B and O suppository was then placed for local analgesia.  The patient tolerated all this well.  He was then awakened and taken to  the recovery room in good condition.  IN/NUANCE  D:01/16/2020 T:01/16/2020 JOB:[id number]

## 2020-01-16 NOTE — Anesthesia Procedure Notes (Signed)
Procedure Name: LMA Insertion Date/Time: 01/16/2020 2:49 PM Performed by: Justice Rocher, CRNA Pre-anesthesia Checklist: Patient identified, Emergency Drugs available, Suction available, Patient being monitored and Timeout performed Patient Re-evaluated:Patient Re-evaluated prior to induction Oxygen Delivery Method: Circle system utilized Preoxygenation: Pre-oxygenation with 100% oxygen Induction Type: IV induction Ventilation: Mask ventilation without difficulty LMA: LMA inserted LMA Size: 4.0 Number of attempts: 1 Airway Equipment and Method: Bite block Placement Confirmation: positive ETCO2,  breath sounds checked- equal and bilateral and CO2 detector Tube secured with: Tape Dental Injury: Teeth and Oropharynx as per pre-operative assessment

## 2020-01-16 NOTE — Transfer of Care (Signed)
Immediate Anesthesia Transfer of Care Note  Patient: Adam Gilbert  Procedure(s) Performed: RADIOACTIVE SEED IMPLANT/BRACHYTHERAPY IMPLANT (N/A )  Patient Location: PACU  Anesthesia Type:General  Level of Consciousness: awake, alert , oriented and patient cooperative  Airway & Oxygen Therapy: Patient Spontanous Breathing  Post-op Assessment: Report given to RN and Post -op Vital signs reviewed and stable  Post vital signs: Reviewed and stable  Last Vitals:  Vitals Value Taken Time  BP 121/82 01/16/20 1607  Temp 36.6 C 01/16/20 1608  Pulse 82 01/16/20 1609  Resp 14 01/16/20 1609  SpO2 94 % 01/16/20 1609  Vitals shown include unvalidated device data.  Last Pain:  Vitals:   01/16/20 1226  TempSrc: Oral  PainSc: 0-No pain      Patients Stated Pain Goal: 4 (56/38/75 6433)  Complications: No complications documented.

## 2020-01-16 NOTE — Progress Notes (Signed)
  Radiation Oncology         [phone] ________________________________  Name: Adam Gilbert MRN: [id number]  Date: 01/16/2020  DOB: Dec 08, 1945       Prostate Seed Implant  MW:UXLKGM, Jule Ser Va  No ref. provider found  DIAGNOSIS:  75 y.o. gentleman with Stage T1c adenocarcinoma of the prostate with Gleason score of 3+4, and PSA of 12.7 (01/10/2020)  Oncology History  Malignant neoplasm of prostate ([place])  09/04/2019 Cancer Staging   Staging form: Prostate, AJCC 8th Edition - Clinical stage from 09/04/2019: Stage IIB (cT1c, cN0, cM0, PSA: 11.7, Grade Group: 2) - Signed by Freeman Caldron, PA-C on 10/25/2019   10/25/2019 Initial Diagnosis   Malignant neoplasm of prostate ([place])    PROCEDURE: Insertion of radioactive I-125 seeds into the prostate gland.  RADIATION DOSE: 145 Gy, definitive therapy.  TECHNIQUE: Adam Gilbert was brought to the operating room with the urologist. He was placed in the dorsolithotomy position. He was catheterized and a rectal tube was inserted. The perineum was shaved, prepped and draped. The ultrasound probe was then introduced into the rectum to see the prostate gland.  TREATMENT DEVICE: A needle grid was attached to the ultrasound probe stand and anchor needles were placed.  3D PLANNING: The prostate was imaged in 3D using a sagittal sweep of the prostate probe. These images were transferred to the planning computer. There, the prostate, urethra and rectum were defined on each axial reconstructed image. Then, the software created an optimized 3D plan and a few seed positions were adjusted. The quality of the plan was reviewed using [facility] information for the target and the following two organs at risk:  Urethra and Rectum.  Then the accepted plan was printed and handed off to the radiation therapist.  Under my supervision, the custom loading of the seeds and spacers was carried out and loaded into sealed vicryl sleeves.  These pre-loaded needles were then  placed into the needle holder.Marland Kitchen  PROSTATE VOLUME STUDY:  Using transrectal ultrasound the volume of the prostate was verified to be 33.1 cc.  SPECIAL TREATMENT PROCEDURE/SUPERVISION AND HANDLING: The pre-loaded needles were then delivered under sagittal guidance. A total of 18 needles were used to deposit 64 seeds in the prostate gland. The individual seed activity was 0.414 mCi.  SpaceOAR:  No  COMPLEX SIMULATION: At the end of the procedure, an anterior radiograph of the pelvis was obtained to document seed positioning and count. Cystoscopy was performed to check the urethra and bladder.  MICRODOSIMETRY: At the end of the procedure, the patient was emitting 0.089 mR/hr at 1 meter. Accordingly, he was considered safe for hospital discharge.  PLAN: The patient will return to the radiation oncology clinic for post implant CT dosimetry in three weeks.   ________________________________  Sheral Apley Tammi Klippel, M.D.

## 2020-01-16 NOTE — Anesthesia Postprocedure Evaluation (Signed)
Anesthesia Post Note  Patient: Adam Gilbert  Procedure(s) Performed: RADIOACTIVE SEED IMPLANT/BRACHYTHERAPY IMPLANT (N/A )     Patient location during evaluation: PACU Anesthesia Type: General Level of consciousness: awake and alert Pain management: pain level controlled Vital Signs Assessment: post-procedure vital signs reviewed and stable Respiratory status: spontaneous breathing, nonlabored ventilation and respiratory function stable Cardiovascular status: blood pressure returned to baseline and stable Postop Assessment: no apparent nausea or vomiting Anesthetic complications: no   No complications documented.  Last Vitals:  Vitals:   01/16/20 1630 01/16/20 1645  BP: 113/77 115/74  Pulse: 77 79  Resp: 14 15  Temp:    SpO2: 93% 92%    Last Pain:  Vitals:   01/16/20 1645  TempSrc:   PainSc: 0-No pain                 Lynda Rainwater

## 2020-01-16 NOTE — H&P (Signed)
01/16/20  H & P unchanged.  Patient verbalizes understanding of planned surgery-  desirous of proceeding with seed implant-    Will proceed with definitive therapy.Tawny Asal

## 2020-01-16 NOTE — Brief Op Note (Signed)
01/16/2020    4:12 PM   PATIENT:  Adam Gilbert  75 y.o. male  PRE-OPERATIVE DIAGNOSIS:  PROSTATE CANCER  POST-OPERATIVE DIAGNOSIS:  PROSTATE CANCER  PROCEDURE:  Procedure(s): RADIOACTIVE SEED IMPLANT/BRACHYTHERAPY IMPLANT (N/A)  SURGEON:  Surgeon(s) and Role:    * Joie Bimler, MD - Primary    * Tyler Pita, MD  PHYSICIAN ASSISTANT:   ASSISTANTS: none   ANESTHESIA:   general  EBL:  1 mL   BLOOD ADMINISTERED:none  DRAINS: none   LOCAL MEDICATIONS USED:  NONE  SPECIMEN:  No Specimen  DISPOSITION OF SPECIMEN:  N/A  COUNTS:  YES  TOURNIQUET:  * No tourniquets in log *  DICTATION: .Other Dictation: Dictation Number [id number]  PLAN OF CARE: Discharge to home after PACU  PATIENT DISPOSITION:  PACU - hemodynamically stable.   Delay start of Pharmacological VTE agent (>24hrs) due to surgical blood loss or risk of bleeding: no

## 2020-01-16 NOTE — Discharge Instructions (Signed)

## 2020-01-17 ENCOUNTER — Telehealth: Payer: Self-pay | Admitting: Cardiovascular Disease

## 2020-01-17 ENCOUNTER — Encounter (HOSPITAL_BASED_OUTPATIENT_CLINIC_OR_DEPARTMENT_OTHER): Payer: Self-pay | Admitting: Urology

## 2020-01-17 NOTE — Telephone Encounter (Signed)
Patient wanted to reschedule appt with Dr. Angelena Form. Told patient's daughter that he doesn't have any availability  Till after June. Wants nurse to see if he could get in sooner. Please call back

## 2020-01-20 ENCOUNTER — Ambulatory Visit: Payer: Medicare Other | Admitting: Cardiovascular Disease

## 2020-01-23 NOTE — Telephone Encounter (Signed)
Left message for patient's daughter to call back. 

## 2020-02-04 NOTE — Telephone Encounter (Signed)
Spoke with pt's daughter.  She scheduled follow up 03/02/20 w Dr. Angelena Form.

## 2020-02-05 ENCOUNTER — Telehealth: Payer: Self-pay | Admitting: *Deleted

## 2020-02-05 NOTE — Progress Notes (Signed)
Radiation Oncology         [phone] ________________________________  Name: Adam Gilbert MRN: [id number]  Date: 02/06/2020  DOB: 01-11-1945  Post-Seed Follow-Up Visit Note  CC: Clinic, Jessica Priest, MD  Diagnosis:   75 y.o. gentleman with Stage T1c adenocarcinoma of the prostate with Gleason score of 3+4, and PSA of 11.7.    ICD-10-CM   1. Malignant neoplasm of prostate ([facility])  C61     Interval Since Last Radiation:  3 weeks 01/16/20:  Insertion of radioactive I-125 seeds into the prostate gland; 145 Gy, definitive therapy without placement of SpaceOAR gel, due to denial of coverage by his insurance.  Narrative:  The patient returns today for routine follow-up.  He is complaining of increased urinary frequency and urinary hesitation symptoms. He filled out a questionnaire regarding urinary function today providing and overall IPSS score of 11 characterizing his symptoms as moderate with mild residual weak stream, frequency, urgency, incomplete emptying and nocturia x3-4/night.  His pre-implant score was 16 with occasional small volume urinary leakage, as well as urinary hesitancy, frequency, urgency, and nocturia x1-2.  He is continue taking Flomax daily as prescribed.  He denies any abdominal pain or bowel symptoms.  He reports a healthy appetite and is maintaining his weight.  He has not noticed any significant change in his energy level and overall, is quite pleased with his progress to date.  ALLERGIES:  has No Known Allergies.  Meds: Current Outpatient Medications  Medication Sig Dispense Refill   aspirin 81 MG tablet Take 81 mg by mouth daily.     atorvastatin (LIPITOR) 20 MG tablet Take 1 tablet (20 mg total) daily by mouth. 90 tablet 3   clopidogrel (PLAVIX) 75 MG tablet Take 1 tablet (75 mg total) daily by mouth. 90 tablet 3   diclofenac sodium (VOLTAREN) 1 % GEL Apply as needed topically (PAIN).     empagliflozin (JARDIANCE) 10 MG TABS tablet Take 10  mg by mouth daily.     metoprolol tartrate (LOPRESSOR) 25 MG tablet Take 0.5 tablets (12.5 mg total) 2 (two) times daily by mouth. 90 tablet 3   Multiple Vitamin (MULTIVITAMIN ADULT PO) Take by mouth daily.     nitroGLYCERIN (NITROSTAT) 0.4 MG SL tablet Place 1 tablet (0.4 mg total) every 5 (five) minutes as needed under the tongue for chest pain (x 3 pillsdaily). 75 tablet 3   tamsulosin (FLOMAX) 0.4 MG CAPS capsule SMARTSIG:1 Capsule(s) By Mouth Every Evening     No current facility-administered medications for this visit.    Physical Findings: In general this is a well appearing Caucasian male in no acute distress. He's alert and oriented x4 and appropriate throughout the examination. Cardiopulmonary assessment is negative for acute distress and he exhibits normal effort.   Lab Findings: Lab Results  Component Value Date   WBC 7.0 01/13/2020   HGB 17.1 (H) 01/13/2020   HCT 51.1 01/13/2020   MCV 94.3 01/13/2020   PLT 222 01/13/2020    Radiographic Findings:  Patient underwent CT imaging in our clinic for post implant dosimetry. The CT will be reviewed by Dr. Tammi Klippel to confirm there is an adequate distribution of radioactive seeds throughout the prostate gland and ensure that there are no seeds in or near the rectum. We suspect the final radiation plan and dosimetry will show appropriate coverage of the prostate gland. He understands that we will call and inform him of any unexpected findings on further review of his imaging and  dosimetry.  Impression/Plan: 75 y.o. gentleman with Stage T1c adenocarcinoma of the prostate with Gleason score of 3+4, and PSA of 11.7. The patient is recovering from the effects of radiation. His urinary symptoms should gradually improve over the next 4-6 months. We talked about this today. He is encouraged by his improvement already and is otherwise pleased with his outcome. We also talked about long-term follow-up for prostate cancer following seed  implant. He understands that ongoing PSA determinations and digital rectal exams will help perform surveillance to rule out disease recurrence. He has a follow up appointment scheduled with Dr. Nila Nephew next week. He understands what to expect with his PSA measures. Patient was also educated today about some of the long-term effects from radiation including a small risk for rectal bleeding and possibly erectile dysfunction. We talked about some of the general management approaches to these potential complications. However, I did encourage the patient to contact our office or return at any point if he has questions or concerns related to his previous radiation and prostate cancer.    Nicholos Johns, PA-C

## 2020-02-05 NOTE — Telephone Encounter (Signed)
CALLED PATIENT TO REMIND OF POST SEED APPTS. FOR 02-06-20, SPOKE WITH PATIENT AND HE IS AWARE OF THESE APPTS.

## 2020-02-06 ENCOUNTER — Encounter: Payer: Self-pay | Admitting: Urology

## 2020-02-06 ENCOUNTER — Encounter: Payer: Self-pay | Admitting: Medical Oncology

## 2020-02-06 ENCOUNTER — Other Ambulatory Visit: Payer: Self-pay

## 2020-02-06 ENCOUNTER — Ambulatory Visit
Admission: RE | Admit: 2020-02-06 | Discharge: 2020-02-06 | Disposition: A | Discharge status: Home / Self Care | Payer: Medicare Other | Source: Ambulatory Visit | Attending: Urology | Admitting: Urology

## 2020-02-06 ENCOUNTER — Ambulatory Visit
Admission: RE | Admit: 2020-02-06 | Discharge: 2020-02-06 | Disposition: A | Discharge status: Home / Self Care | Payer: Medicare Other | Source: Ambulatory Visit | Attending: Radiation Oncology | Admitting: Radiation Oncology

## 2020-02-06 VITALS — BP 96/63 | HR 77 | Temp 97.1°F | Resp 18 | Wt 182.5 lb

## 2020-02-06 DIAGNOSIS — R3911 Hesitancy of micturition: Secondary | ICD-10-CM | POA: Insufficient documentation

## 2020-02-06 DIAGNOSIS — C61 Malignant neoplasm of prostate: Secondary | ICD-10-CM

## 2020-02-06 DIAGNOSIS — Z7982 Long term (current) use of aspirin: Secondary | ICD-10-CM | POA: Insufficient documentation

## 2020-02-06 DIAGNOSIS — R350 Frequency of micturition: Secondary | ICD-10-CM | POA: Insufficient documentation

## 2020-02-06 DIAGNOSIS — Z923 Personal history of irradiation: Secondary | ICD-10-CM | POA: Insufficient documentation

## 2020-02-06 DIAGNOSIS — Z79899 Other long term (current) drug therapy: Secondary | ICD-10-CM | POA: Insufficient documentation

## 2020-02-06 NOTE — Progress Notes (Signed)
  Radiation Oncology         [phone] ________________________________  Name: Adam Gilbert MRN: [id number]  Date: 02/06/2020  DOB: 1945/08/17  COMPLEX SIMULATION NOTE  NARRATIVE:  The patient was brought to the [facility] today following prostate seed implantation approximately one month ago.  Identity was confirmed.  All relevant records and images related to the planned course of therapy were reviewed.  Then, the patient was set-up supine.  CT images were obtained.  The CT images were loaded into the planning software.  Then the prostate and rectum were contoured.  Treatment planning then occurred.  The implanted iodine 125 seeds were identified by the physics staff for projection of radiation distribution  I have requested : 3D Simulation  I have requested a DVH of the following structures: Prostate and rectum.    ________________________________  Adam Gilbert, M.D.

## 2020-02-06 NOTE — Progress Notes (Signed)
Patient is here today for a post-seed follow-up. Patient reports Dysuria States that he has had dysuria for a couple of weeks. Hematuria None Nocturia x4  Leakage Some Urgency Some Stream Moderate Bowels None Fatigue None Emptying bladder States that he does not empty his bladder IPPS score 11 Next appointment with urologist States that he has an appointment next week unsure of the day. Vitals:   02/06/20 0853  BP: 96/63  Pulse: 77  Resp: 18  Temp: (!) 97.1 F (36.2 C)  TempSrc: Temporal  SpO2: 100%  Weight: 82.8 kg

## 2020-03-02 ENCOUNTER — Encounter: Payer: Self-pay | Admitting: Cardiovascular Disease

## 2020-03-02 ENCOUNTER — Other Ambulatory Visit: Payer: Self-pay

## 2020-03-02 ENCOUNTER — Ambulatory Visit (INDEPENDENT_AMBULATORY_CARE_PROVIDER_SITE_OTHER): Payer: Medicare Other | Admitting: Cardiovascular Disease

## 2020-03-02 VITALS — BP 102/72 | HR 82 | Ht 72.0 in | Wt 187.6 lb

## 2020-03-02 DIAGNOSIS — Z720 Tobacco use: Secondary | ICD-10-CM

## 2020-03-02 DIAGNOSIS — I2510 Atherosclerotic heart disease of native coronary artery without angina pectoris: Secondary | ICD-10-CM

## 2020-03-02 DIAGNOSIS — E7800 Pure hypercholesterolemia, unspecified: Secondary | ICD-10-CM

## 2020-03-02 NOTE — Patient Instructions (Signed)
Medication Instructions:  NO CHANGES *If you need a refill on your cardiac medications before your next appointment, please call your pharmacy*   Lab Work: NONE If you have labs (blood work) drawn today and your tests are completely normal, you will receive your results only by: Marland Kitchen MyChart Message (if you have MyChart) OR . A paper copy in the mail If you have any lab test that is abnormal or we need to change your treatment, we will call you to review the results.   Testing/Procedures: NONE   Follow-Up: At [facility], you and your health needs are our priority.  As part of our continuing mission to provide you with exceptional heart care, we have created designated Provider Care Teams.  These Care Teams include your primary Cardiologist (physician) and Advanced Practice Providers (APPs -  Physician Assistants and Nurse Practitioners) who all work together to provide you with the care you need, when you need it.  Your next appointment:   12 month(s)  The format for your next appointment:   In Person  Provider:   You may see Lauree Chandler, MD or one of the following Advanced Practice Providers on your designated Care Team:    Melina Copa, PA-C  Ermalinda Barrios, PA-C   Other Instructions

## 2020-03-02 NOTE — Progress Notes (Signed)
Chief Complaint  Patient presents with  . Follow-up    CAD    History of Present Illness: 75 yo male with history of CAD, hyperlipidemia, tobacco abuse, OA, DM, prostate cancer and PAD who is here today for cardiac follow up. He had a NSTEMI November 2009 and had a bare metal stent placed in the mid LAD. The diagonal branch was jailed by the stent. PTCA was performed in the diagonal branch. The Circumflex and the RCA were free of obstructive CAD. Treadmill stress test 12/03/12 with good exercise tolerance, no ischemic EKG changes. Echo November 2014 with normal LV function. No valve disease. Stress test November 2018 with no ischemic EKG changes.  He is being treated for prostate cancer.   He is here today for follow up. The patient denies any chest pain, dyspnea, palpitations, lower extremity edema, orthopnea, PND, dizziness, near syncope or syncope.   Primary Care Physician: [facility]   Past Medical History:  Diagnosis Date  . Arthritis   . Colon polyps   . Coronary artery disease    s/p PTCA, DES to the LAD November 2009  . DM type 2 (diabetes mellitus, type 2) ([place])   . Dyspnea    with exertion climbing steps upward  . History of chicken pox as child  . Hyperlipidemia   . OA (osteoarthritis)   . Prostate cancer ([place])   . PVD (peripheral vascular disease) with claudication ([place])   . Tobacco abuse   . Wears dentures    full top , partail bottom  . Wears glasses   . Wears hearing aid in both ears     Past Surgical History:  Procedure Laterality Date  . BACK SURGERY  2011   x 2 lower back  . CARDIAC CATHETERIZATION  2009   2 DES stents to the LAD  . PROSTATE BIOPSY  2021  . RADIOACTIVE SEED IMPLANT N/A 01/16/2020   Procedure: RADIOACTIVE SEED IMPLANT/BRACHYTHERAPY IMPLANT;  Surgeon: Joie Bimler, MD;  Location: [facility];  Service: Urology;  Laterality: N/A;    Current Outpatient Medications  Medication Sig Dispense Refill  . aspirin  81 MG tablet Take 81 mg by mouth daily.    Marland Kitchen atorvastatin (LIPITOR) 20 MG tablet Take 1 tablet (20 mg total) daily by mouth. 90 tablet 3  . calcium citrate (CALCITRATE - DOSED IN MG ELEMENTAL CALCIUM) 950 (200 Ca) MG tablet Take 1 tablet by mouth daily.    . clopidogrel (PLAVIX) 75 MG tablet Take 1 tablet (75 mg total) daily by mouth. 90 tablet 3  . diclofenac sodium (VOLTAREN) 1 % GEL Apply as needed topically (PAIN).    Marland Kitchen empagliflozin (JARDIANCE) 10 MG TABS tablet Take 10 mg by mouth daily.    . metoprolol tartrate (LOPRESSOR) 25 MG tablet Take 0.5 tablets (12.5 mg total) 2 (two) times daily by mouth. 90 tablet 3  . Multiple Vitamin (MULTIVITAMIN ADULT PO) Take by mouth daily.    . nitroGLYCERIN (NITROSTAT) 0.4 MG SL tablet Place 1 tablet (0.4 mg total) every 5 (five) minutes as needed under the tongue for chest pain (x 3 pillsdaily). 75 tablet 3  . Omega-3 Fatty Acids (OMEGA 3 PO) Take 540 mg by mouth daily.    . tamsulosin (FLOMAX) 0.4 MG CAPS capsule SMARTSIG:1 Capsule(s) By Mouth Every Evening     No current facility-administered medications for this visit.    No Known Allergies  Social History   Socioeconomic History  . Marital status: Married  Spouse name: Not on file  . Number of children: 2  . Years of education: Not on file  . Highest education level: Not on file  Occupational History  . Occupation: Retired  Tobacco Use  . Smoking status: Current Every Day Smoker    Packs/day: 0.50    Years: 50.00    Pack years: 25.00    Types: Cigarettes  . Smokeless tobacco: Never Used  Vaping Use  . Vaping Use: Never used  Substance and Sexual Activity  . Alcohol use: Yes    Alcohol/week: 1.0 standard drink    Types: 1 Standard drinks or equivalent per week    Comment: rare  . Drug use: No  . Sexual activity: Not Currently    Comment: wife has alzeheimers  Other Topics Concern  . Not on file  Social History Narrative  . Not on file   Social Determinants of Health    Financial Resource Strain: Not on file  Food Insecurity: Not on file  Transportation Needs: Not on file  Physical Activity: Not on file  Stress: Not on file  Social Connections: Not on file  Intimate Partner Violence: Not on file    Family History  Problem Relation Age of Onset  . Heart attack Father   . Breast cancer Neg Hx   . Prostate cancer Neg Hx   . Colon cancer Neg Hx   . Pancreatic cancer Neg Hx     Review of Systems:  As stated in the HPI and otherwise negative.   BP 102/72   Pulse 82   Ht 6' (1.829 m)   Wt 187 lb 9.6 oz (85.1 kg)   SpO2 98%   BMI 25.44 kg/m   Physical Examination: General: Well developed, well nourished, NAD  HEENT: OP clear, mucus membranes moist  SKIN: warm, dry. No rashes. Neuro: No focal deficits  Musculoskeletal: Muscle strength 5/5 all ext  Psychiatric: Mood and affect normal  Neck: No JVD, no carotid bruits, no thyromegaly, no lymphadenopathy.  Lungs:Clear bilaterally, no wheezes, rhonci, crackles Cardiovascular: Regular rate and rhythm. No murmurs, gallops or rubs. Abdomen:Soft. Bowel sounds present. Non-tender.  Extremities: No lower extremity edema. Pulses are 2 + in the bilateral DP/PT.  Cardiac cath 11/12/07:  Left main coronary artery and left main coronary artery was  free of significant disease.  Left anterior descending artery: Please note, left anterior descending  artery gave rise to a diagonal branch, a septal perforator, and a small  and large diagonal branch. There was 95% stenosis in the mid LAD just  distal to the large diagonal branch. There was 50% narrowing in the  proximal portion of the diagonal branch extending with some disease  extending to the ostium. There was a lesion in the mid-to-distal LAD  that was primarily systolic compression up to 70% narrowing.  The circumflex artery branch that successfully gave rise to a large  marginal branch and a posterolateral branch. There was also a small  ramus  branch and an atrial branch. These vessels were free of  significant disease.  The right coronary artery: The right coronary artery was a moderate-  sized vessel gave rise to conus branch to right ventricle branch,  posterior descending branch, and a posterolateral branch. This vessel  is free of significant disease.  The left ventriculogram: The left ventriculogram performed in the RAO  projection showed hypokinesis of a very tip of the apex. This was a  very small area. The estimated fraction was 60%.  Echo 11/12/12: Left ventricle: The cavity size was normal. Wall thickness was normal. Systolic function was normal. The estimated ejection fraction was in the range of 55% to 60%. Wall motion was normal; there were no regional wall motion abnormalities. Doppler parameters are consistent with abnormal left ventricular relaxation (grade 1 diastolic dysfunction). - Mitral valve: Calcified annulus.  EKG:  EKG is not ordered today. The ekg ordered today demonstrates   Recent Labs: 01/13/2020: ALT 15; BUN 22; Creatinine, Ser 0.89; Hemoglobin 17.1; Platelets 222; Potassium 4.7; Sodium 140     Wt Readings from Last 3 Encounters:  03/02/20 187 lb 9.6 oz (85.1 kg)  02/06/20 182 lb 8 oz (82.8 kg)  01/16/20 184 lb 9.6 oz (83.7 kg)     Other studies Reviewed: Additional studies/ records that were reviewed today include: . Review of the above records demonstrates:    Assessment and Plan:   1. CAD without angina: Normal stress test in November 2018. No chest pain. Continue ASA, Plavix, statin and beta blocker.      2. HLD: Lipids are followed in primary care in the [place]. Continue statin  3. Tobacco abuse: I have asked him to stop smoking  Current medicines are reviewed at length with the patient today.  The patient does not have concerns regarding medicines.  The following changes have been made:  no change  Labs/ tests ordered today include:   No orders of the defined  types were placed in this encounter.  Disposition:   FU with me in 12  months  Signed, Lauree Chandler, MD 03/02/2020 9:53 AM    [facility] [place], [place], [place]  [address] Phone: [phone]; Fax: [phone]

## 2020-03-09 ENCOUNTER — Ambulatory Visit
Admission: RE | Admit: 2020-03-09 | Discharge: 2020-03-09 | Disposition: A | Discharge status: Home / Self Care | Payer: Medicare Other | Source: Ambulatory Visit | Attending: Radiation Oncology | Admitting: Radiation Oncology

## 2020-03-09 ENCOUNTER — Encounter: Payer: Self-pay | Admitting: Radiation Oncology

## 2020-03-09 DIAGNOSIS — C61 Malignant neoplasm of prostate: Secondary | ICD-10-CM | POA: Insufficient documentation

## 2020-03-10 NOTE — Progress Notes (Signed)
  Radiation Oncology         [phone] ________________________________  Name: Adam Gilbert MRN: [id number]  Date: 03/09/2020  DOB: 01/27/1945  3D Planning Note   Prostate Brachytherapy Post-Implant Dosimetry  Diagnosis: 75 y.o. gentleman with Stage T1c adenocarcinoma of the prostate with Gleason score of 3+4, and PSA of 11.7.  Narrative: On a previous date, Adam Gilbert returned following prostate seed implantation for post implant planning. He underwent CT scan complex simulation to delineate the three-dimensional structures of the pelvis and demonstrate the radiation distribution.  Since that time, the seed localization, and complex isodose planning with dose volume histograms have now been completed.  Results:   Prostate Coverage - The dose of radiation delivered to the 90% or more of the prostate gland (D90) was 96.31% of the prescription dose. This exceeds our goal of greater than 90%. Rectal Sparing - The volume of rectal tissue receiving the prescription dose or higher was 0.0 cc. This falls under our thresholds tolerance of 1.0 cc.  Impression: The prostate seed implant appears to show adequate target coverage and appropriate rectal sparing.  Plan:  The patient will continue to follow with urology for ongoing PSA determinations. I would anticipate a high likelihood for local tumor control with minimal risk for rectal morbidity.  ________________________________  Sheral Apley Tammi Klippel, M.D.

## 2020-07-12 DIAGNOSIS — Z20822 Contact with and (suspected) exposure to covid-19: Secondary | ICD-10-CM | POA: Diagnosis not present

## 2020-10-15 DIAGNOSIS — Z23 Encounter for immunization: Secondary | ICD-10-CM | POA: Diagnosis not present

## 2020-10-21 DIAGNOSIS — S335XXA Sprain of ligaments of lumbar spine, initial encounter: Secondary | ICD-10-CM | POA: Diagnosis not present

## 2020-11-26 DIAGNOSIS — Z20828 Contact with and (suspected) exposure to other viral communicable diseases: Secondary | ICD-10-CM | POA: Diagnosis not present

## 2021-01-01 DIAGNOSIS — Z20828 Contact with and (suspected) exposure to other viral communicable diseases: Secondary | ICD-10-CM | POA: Diagnosis not present

## 2021-01-01 DIAGNOSIS — R051 Acute cough: Secondary | ICD-10-CM | POA: Diagnosis not present

## 2021-02-12 DIAGNOSIS — Z20822 Contact with and (suspected) exposure to covid-19: Secondary | ICD-10-CM | POA: Diagnosis not present

## 2021-04-01 DIAGNOSIS — Z20822 Contact with and (suspected) exposure to covid-19: Secondary | ICD-10-CM | POA: Diagnosis not present

## 2021-04-19 DIAGNOSIS — Z20822 Contact with and (suspected) exposure to covid-19: Secondary | ICD-10-CM | POA: Diagnosis not present

## 2021-06-12 IMAGING — DX DG CHEST 2V
2 series · 2 of 2 positions shown · non-contrast
Comparison: January 31, 2018.

CLINICAL DATA: Preop.

EXAM:
CHEST - 2 VIEW

[chest pa]
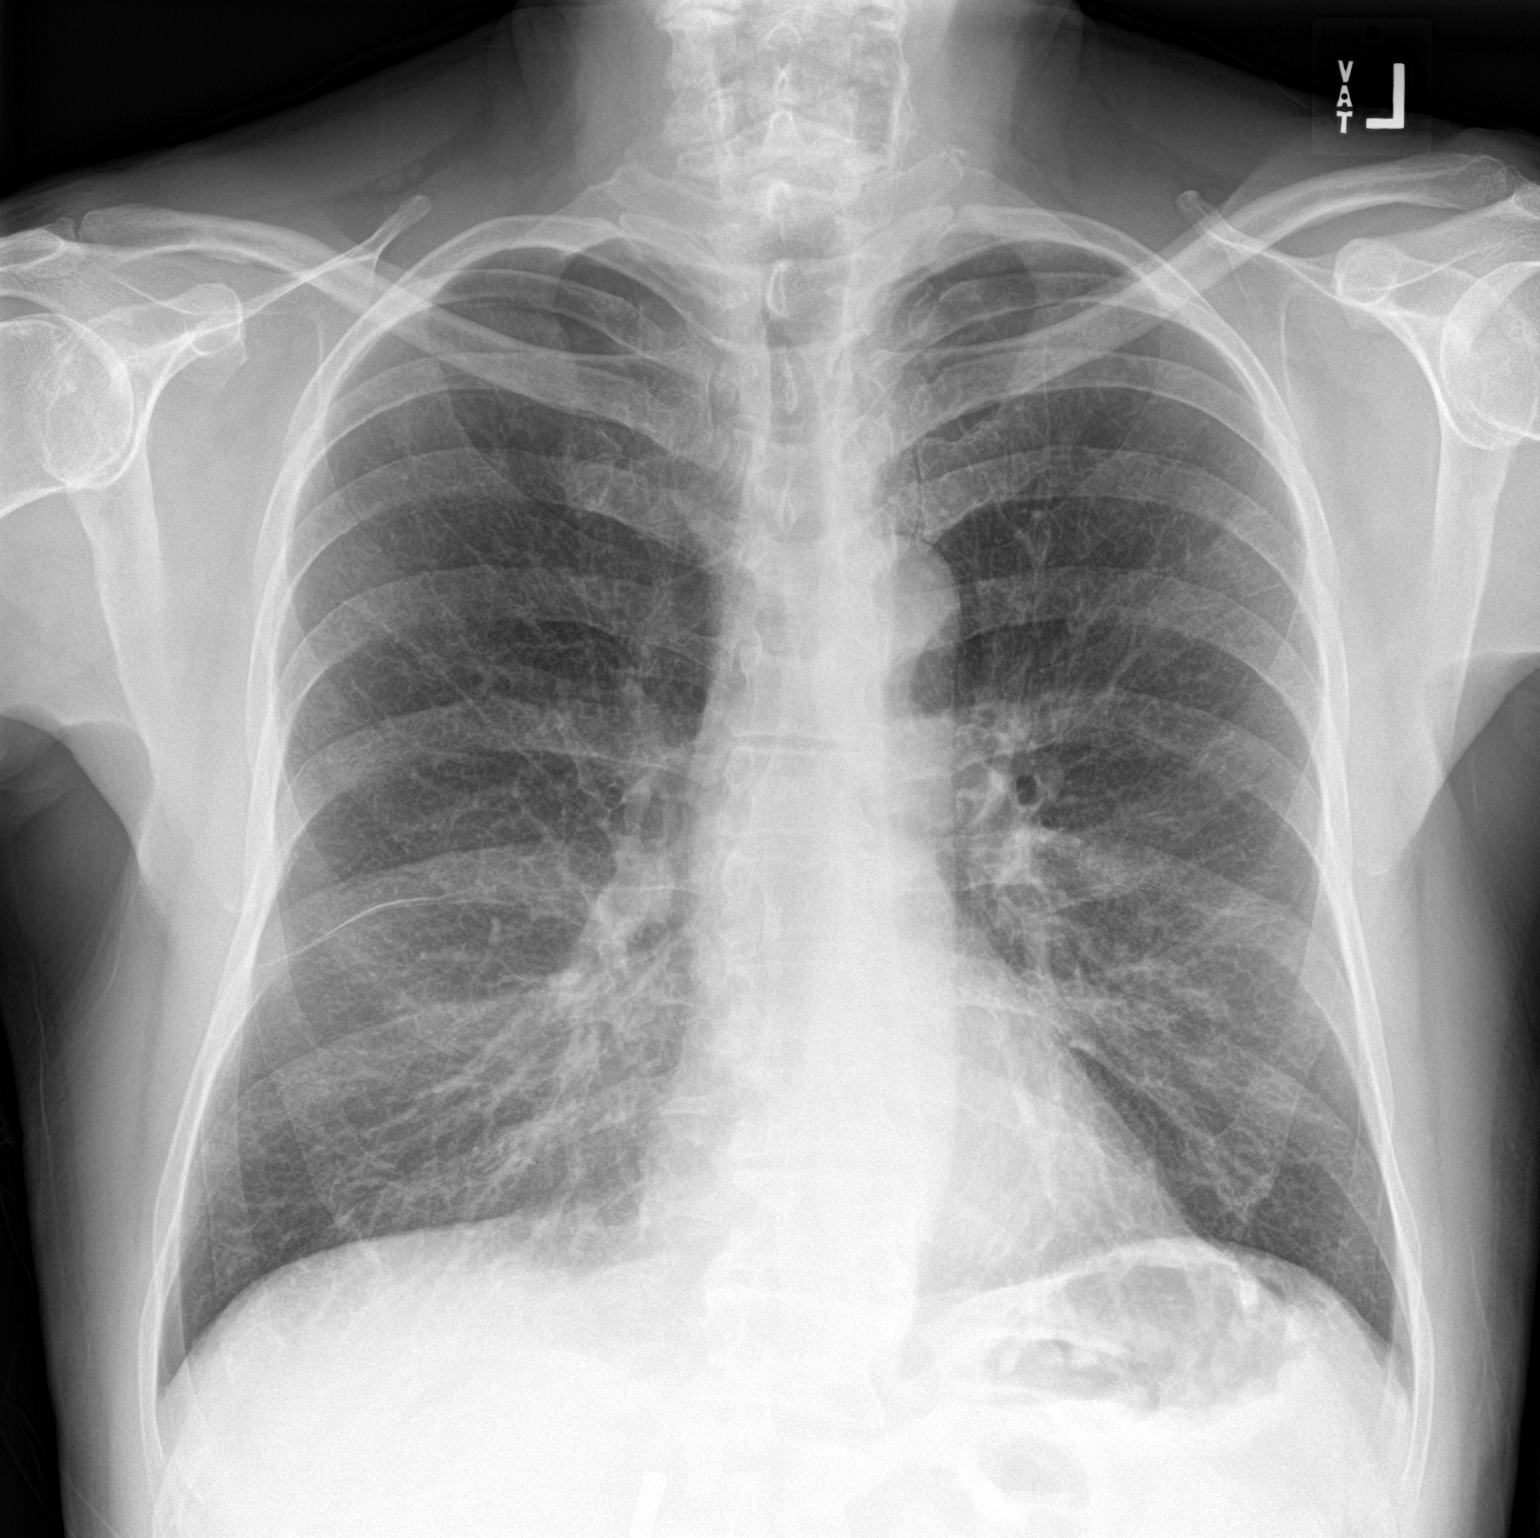

[chest lat]
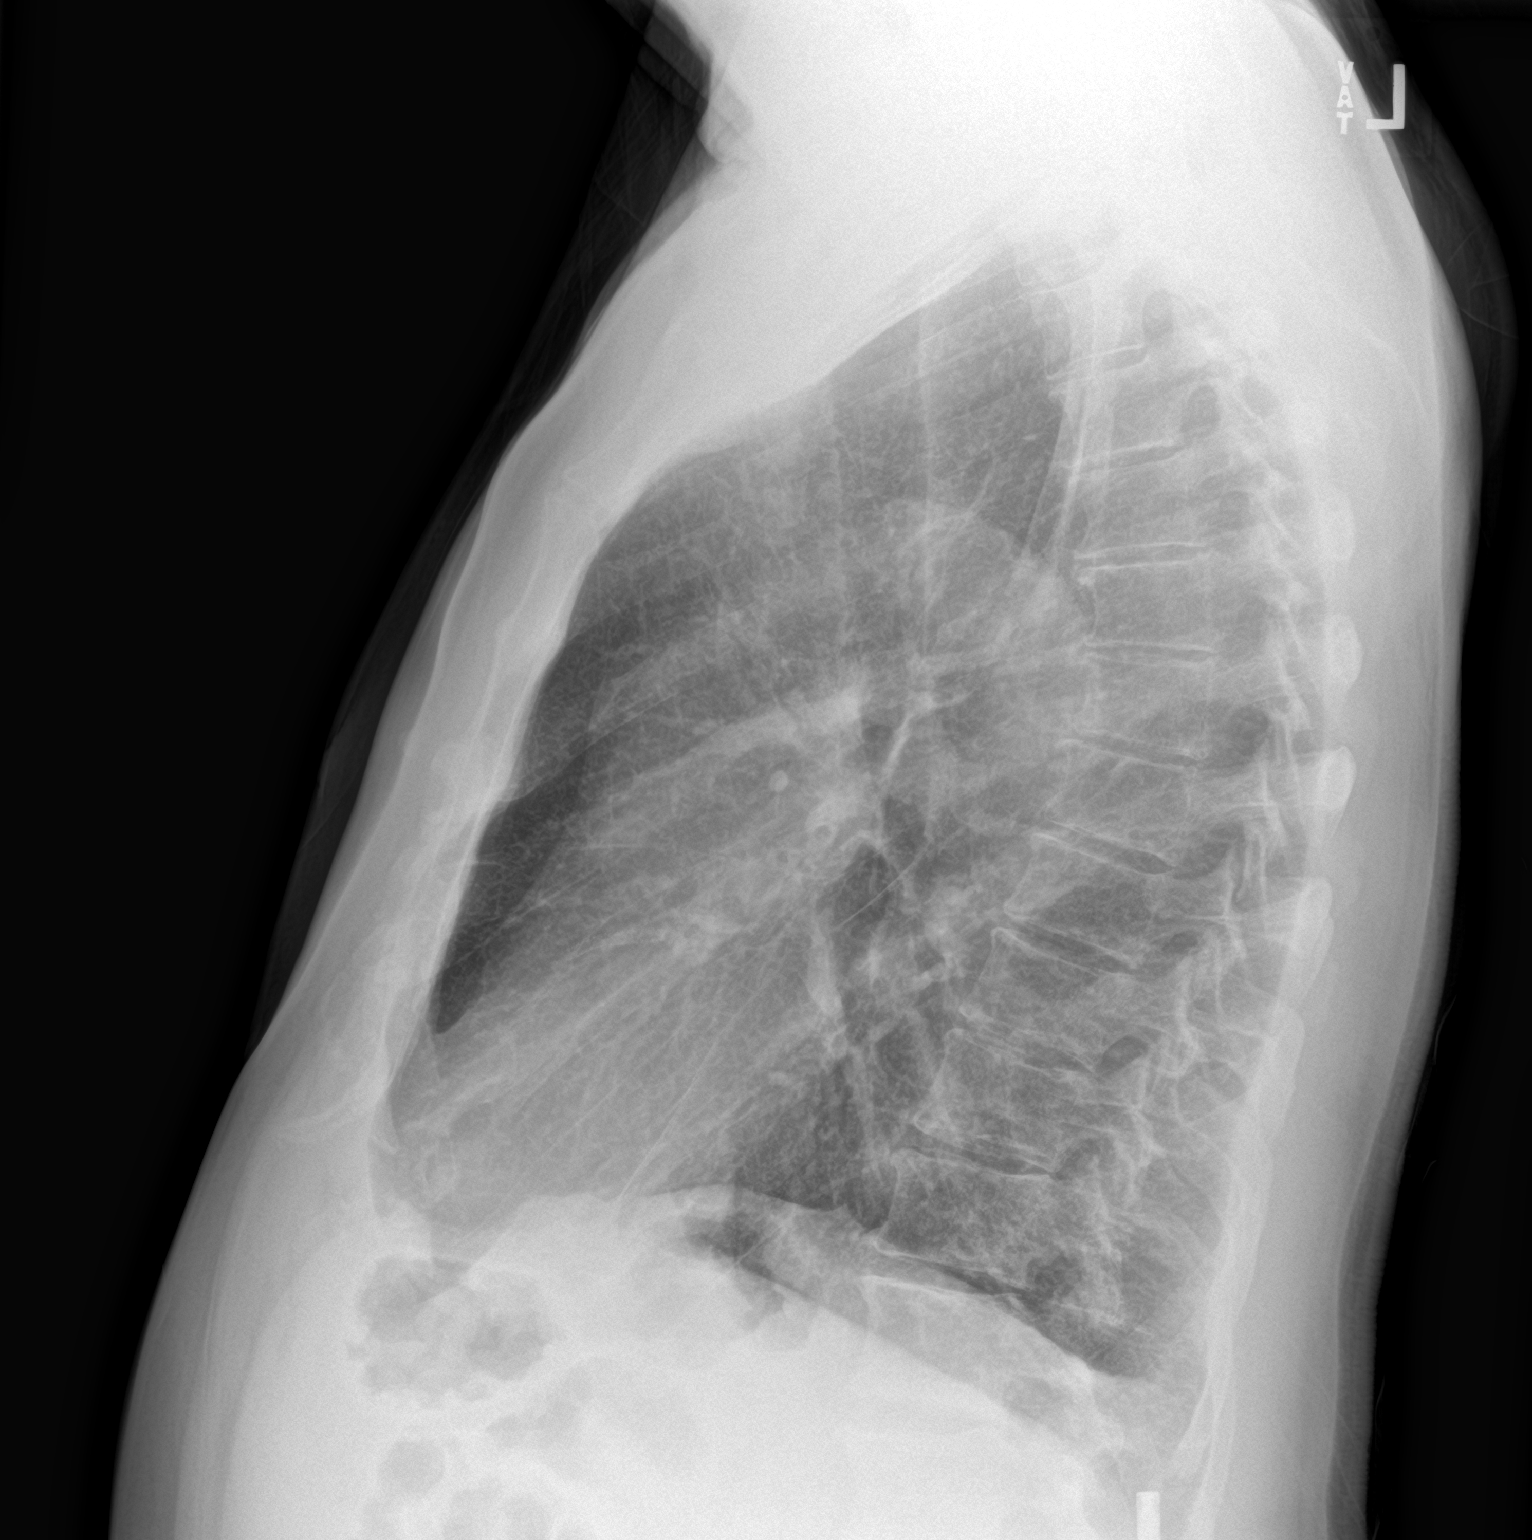

[2 of 2 positions shown; findings below may reference images not displayed]

FINDINGS: The heart size and mediastinal contours are within normal limits.
Both lungs are clear. The visualized skeletal structures are
unremarkable.
IMPRESSION: No active cardiopulmonary disease.

## 2021-09-08 ENCOUNTER — Encounter: Payer: Self-pay | Admitting: Cardiovascular Disease

## 2021-09-08 ENCOUNTER — Ambulatory Visit: Payer: Medicare Other | Attending: Cardiovascular Disease | Admitting: Cardiovascular Disease

## 2021-09-08 VITALS — BP 108/70 | HR 81 | Ht 72.0 in | Wt 182.6 lb

## 2021-09-08 DIAGNOSIS — I2510 Atherosclerotic heart disease of native coronary artery without angina pectoris: Secondary | ICD-10-CM | POA: Diagnosis not present

## 2021-09-08 DIAGNOSIS — Z720 Tobacco use: Secondary | ICD-10-CM

## 2021-09-08 DIAGNOSIS — E7800 Pure hypercholesterolemia, unspecified: Secondary | ICD-10-CM | POA: Diagnosis not present

## 2021-09-08 NOTE — Patient Instructions (Signed)
Medication Instructions:  Your physician recommends that you continue on your current medications as directed. Please refer to the Current Medication list given to you today.  *If you need a refill on your cardiac medications before your next appointment, please call your pharmacy*   Lab Work: none If you have labs (blood work) drawn today and your tests are completely normal, you will receive your results only by: [place] (if you have MyChart) OR A paper copy in the mail If you have any lab test that is abnormal or we need to change your treatment, we will call you to review the results.   Testing/Procedures: none   Follow-Up: At [facility], you and your health needs are our priority.  As part of our continuing mission to provide you with exceptional heart care, we have created designated Provider Care Teams.  These Care Teams include your primary Cardiologist (physician) and Advanced Practice Providers (APPs -  Physician Assistants and Nurse Practitioners) who all work together to provide you with the care you need, when you need it.  We recommend signing up for the patient portal called "MyChart".  Sign up information is provided on this After Visit Summary.  MyChart is used to connect with patients for Virtual Visits (Telemedicine).  Patients are able to view lab/test results, encounter notes, upcoming appointments, etc.  Non-urgent messages can be sent to your provider as well.   To learn more about what you can do with MyChart, go to [email or web address].    Your next appointment:   12 month(s)  The format for your next appointment:   In Person  Provider:   Lauree Chandler, MD     Other Instructions    Important Information About Sugar

## 2021-09-08 NOTE — Progress Notes (Signed)
Chief Complaint  Patient presents with   Follow-up    CAD   History of Present Illness: 76 yo male with history of CAD, hyperlipidemia, tobacco abuse, OA, DM, prostate cancer and PAD who is here today for cardiac follow up. He had a NSTEMI November 2009 and had a bare metal stent placed in the mid LAD. The diagonal branch was jailed by the stent. PTCA was performed in the diagonal branch. The Circumflex and the RCA were free of obstructive CAD. Treadmill stress test 12/03/12 with good exercise tolerance, no ischemic EKG changes. Echo November 2014 with normal LV function. No valve disease. Stress test November 2018 with no ischemic EKG changes.    He is here today for follow up. The patient denies any chest pain, dyspnea, palpitations, lower extremity edema, orthopnea, PND, dizziness, near syncope or syncope.   Primary Care Physician: [facility]  Past Medical History:  Diagnosis Date   Arthritis    Colon polyps    Coronary artery disease    s/p PTCA, DES to the LAD November 2009   DM type 2 (diabetes mellitus, type 2) ([facility])    Dyspnea    with exertion climbing steps upward   History of chicken pox as child   Hyperlipidemia    OA (osteoarthritis)    Prostate cancer ([place])    PVD (peripheral vascular disease) with claudication ([place])    Tobacco abuse    Wears dentures    full top , partail bottom   Wears glasses    Wears hearing aid in both ears     Past Surgical History:  Procedure Laterality Date   BACK SURGERY  2011   x 2 lower back   CARDIAC CATHETERIZATION  2009   2 DES stents to the LAD   PROSTATE BIOPSY  2021   RADIOACTIVE SEED IMPLANT N/A 01/16/2020   Procedure: RADIOACTIVE SEED IMPLANT/BRACHYTHERAPY IMPLANT;  Surgeon: Joie Bimler, MD;  Location: [facility];  Service: Urology;  Laterality: N/A;    Current Outpatient Medications  Medication Sig Dispense Refill   aspirin 81 MG tablet Take 81 mg by mouth daily.     atorvastatin  (LIPITOR) 20 MG tablet Take 1 tablet (20 mg total) daily by mouth. 90 tablet 3   calcium citrate (CALCITRATE - DOSED IN MG ELEMENTAL CALCIUM) 950 (200 Ca) MG tablet Take 1 tablet by mouth daily.     clopidogrel (PLAVIX) 75 MG tablet Take 1 tablet (75 mg total) daily by mouth. 90 tablet 3   diclofenac sodium (VOLTAREN) 1 % GEL Apply as needed topically (PAIN).     empagliflozin (JARDIANCE) 10 MG TABS tablet Take 10 mg by mouth daily.     metoprolol tartrate (LOPRESSOR) 25 MG tablet Take 0.5 tablets (12.5 mg total) 2 (two) times daily by mouth. 90 tablet 3   Multiple Vitamin (MULTIVITAMIN ADULT PO) Take by mouth daily.     nitroGLYCERIN (NITROSTAT) 0.4 MG SL tablet Place 1 tablet (0.4 mg total) every 5 (five) minutes as needed under the tongue for chest pain (x 3 pillsdaily). 75 tablet 3   Omega-3 Fatty Acids (OMEGA 3 PO) Take 540 mg by mouth daily.     tamsulosin (FLOMAX) 0.4 MG CAPS capsule SMARTSIG:1 Capsule(s) By Mouth Every Evening     No current facility-administered medications for this visit.    No Known Allergies  Social History   Socioeconomic History   Marital status: Married    Spouse name: Not on file  Number of children: 2   Years of education: Not on file   Highest education level: Not on file  Occupational History   Occupation: Retired  Tobacco Use   Smoking status: Every Day    Packs/day: 0.50    Years: 50.00    Total pack years: 25.00    Types: Cigarettes   Smokeless tobacco: Never  Vaping Use   Vaping Use: Never used  Substance and Sexual Activity   Alcohol use: Yes    Alcohol/week: 1.0 standard drink of alcohol    Types: 1 Standard drinks or equivalent per week    Comment: rare   Drug use: No   Sexual activity: Not Currently    Comment: wife has alzeheimers  Other Topics Concern   Not on file  Social History Narrative   Not on file   Social Determinants of Health   Financial Resource Strain: Not on file  Food Insecurity: Not on file   Transportation Needs: Not on file  Physical Activity: Not on file  Stress: Not on file  Social Connections: Not on file  Intimate Partner Violence: Not on file    Family History  Problem Relation Age of Onset   Heart attack Father    Breast cancer Neg Hx    Prostate cancer Neg Hx    Colon cancer Neg Hx    Pancreatic cancer Neg Hx     Review of Systems:  As stated in the HPI and otherwise negative.   BP 108/70   Pulse 81   Ht 6' (1.829 m)   Wt 182 lb 9.6 oz (82.8 kg)   SpO2 99%   BMI 24.77 kg/m   Physical Examination: General: Well developed, well nourished, NAD  HEENT: OP clear, mucus membranes moist  SKIN: warm, dry. No rashes. Neuro: No focal deficits  Musculoskeletal: Muscle strength 5/5 all ext  Psychiatric: Mood and affect normal  Neck: No JVD, no carotid bruits, no thyromegaly, no lymphadenopathy.  Lungs:Clear bilaterally, no wheezes, rhonci, crackles Cardiovascular: Regular rate and rhythm. No murmurs, gallops or rubs. Abdomen:Soft. Bowel sounds present. Non-tender.  Extremities: No lower extremity edema. Pulses are 2 + in the bilateral DP/PT.  Cardiac cath 11/12/07:  Left main coronary artery and left main coronary artery was  free of significant disease.  Left anterior descending artery: Please note, left anterior descending  artery gave rise to a diagonal branch, a septal perforator, and a small  and large diagonal branch. There was 95% stenosis in the mid LAD just  distal to the large diagonal branch. There was 50% narrowing in the  proximal portion of the diagonal branch extending with some disease  extending to the ostium. There was a lesion in the mid-to-distal LAD  that was primarily systolic compression up to 70% narrowing.  The circumflex artery branch that successfully gave rise to a large  marginal branch and a posterolateral branch. There was also a small  ramus branch and an atrial branch. These vessels were free of  significant disease.   The right coronary artery: The right coronary artery was a moderate-  sized vessel gave rise to conus branch to right ventricle branch,  posterior descending branch, and a posterolateral branch. This vessel  is free of significant disease.  The left ventriculogram: The left ventriculogram performed in the RAO  projection showed hypokinesis of a very tip of the apex. This was a  very small area. The estimated fraction was 60%.   Echo 11/12/12: Left ventricle: The cavity  size was normal. Wall thickness   was normal. Systolic function was normal. The estimated   ejection fraction was in the range of 55% to 60%. Wall   motion was normal; there were no regional wall motion   abnormalities. Doppler parameters are consistent with   abnormal left ventricular relaxation (grade 1 diastolic   dysfunction). - Mitral valve: Calcified annulus.  EKG:  EKG is ordered today. The ekg ordered today demonstrates Sinus  Recent Labs: No results found for requested labs within last 365 days.     Wt Readings from Last 3 Encounters:  09/08/21 182 lb 9.6 oz (82.8 kg)  03/02/20 187 lb 9.6 oz (85.1 kg)  02/06/20 182 lb 8 oz (82.8 kg)     Other studies Reviewed: Additional studies/ records that were reviewed today include: . Review of the above records demonstrates:    Assessment and Plan:   1. CAD without angina: He has no chest pain. Continue ASA, Plavix, beta blocker and statin.       2. HLD: Lipids are followed in primary care in the [place]. Continue statin.   3. Tobacco abuse: Smoking cessation advised  Labs/ tests ordered today include:   Orders Placed This Encounter  Procedures   EKG 12-Lead   Disposition:   F/U with me in 12  months  Signed, Lauree Chandler, MD 09/08/2021 4:19 PM    [facility] [place], [place], [place]  [address] Phone: [phone]; Fax: [phone]

## 2021-10-04 DIAGNOSIS — D179 Benign lipomatous neoplasm, unspecified: Secondary | ICD-10-CM | POA: Diagnosis not present

## 2021-10-04 DIAGNOSIS — M549 Dorsalgia, unspecified: Secondary | ICD-10-CM | POA: Diagnosis not present

## 2021-10-04 DIAGNOSIS — M419 Scoliosis, unspecified: Secondary | ICD-10-CM | POA: Diagnosis not present

## 2021-10-25 DIAGNOSIS — Z23 Encounter for immunization: Secondary | ICD-10-CM | POA: Diagnosis not present

## 2022-03-30 ENCOUNTER — Other Ambulatory Visit: Payer: Self-pay

## 2022-03-30 ENCOUNTER — Other Ambulatory Visit: Payer: Self-pay | Admitting: Radiation Oncology

## 2022-03-30 ENCOUNTER — Telehealth: Payer: Self-pay | Admitting: Radiation Oncology

## 2022-03-30 ENCOUNTER — Inpatient Hospital Stay
Admission: RE | Admit: 2022-03-30 | Discharge: 2022-03-30 | Disposition: A | Discharge status: Home / Self Care | Payer: Self-pay | Source: Ambulatory Visit | Attending: Radiation Oncology | Admitting: Radiation Oncology

## 2022-03-30 ENCOUNTER — Telehealth: Payer: Self-pay

## 2022-03-30 DIAGNOSIS — C61 Malignant neoplasm of prostate: Secondary | ICD-10-CM

## 2022-03-30 NOTE — Telephone Encounter (Signed)
3/27 @ 9:33 am Left voicemail for patient to call our office to be schedule for consult with Ashlyn Bruning.

## 2022-03-30 NOTE — Telephone Encounter (Signed)
-----   Message from Michaell Cowing sent at 03/30/2022 10:07 AM EDT ----- Regarding: RE: referral Hello Malayshia All,   We did received ref/auth from [facility].  This patient is scheduled for 4/18 with Ashlyn Bruning in her 2:30 pm slot, because he is a recon pt.   Levada Dy  ----- Message ----- From: Georgette Shell, RN Sent: 03/30/2022   9:33 AM EDT To: Michaell Cowing Subject: referral                                       Pt needs to be scheduled with Dr. Tammi Klippel.  Needs more specific area radiation then we can give here.  [facility] have already contacted [facility] to [facility] Elvina Sidle to authorization.  It should be in place.

## 2022-04-21 ENCOUNTER — Encounter: Payer: Self-pay | Admitting: Urology

## 2022-04-21 ENCOUNTER — Ambulatory Visit
Admission: RE | Admit: 2022-04-21 | Discharge: 2022-04-21 | Disposition: A | Discharge status: Home / Self Care | Payer: No Typology Code available for payment source | Source: Ambulatory Visit | Attending: Urology | Admitting: Urology

## 2022-04-21 VITALS — Ht 72.0 in | Wt 180.0 lb

## 2022-04-21 DIAGNOSIS — C61 Malignant neoplasm of prostate: Secondary | ICD-10-CM

## 2022-04-21 NOTE — Progress Notes (Signed)
Radiation Oncology         [phone]) [phone] ________________________________  Outpatient Re-Consultation - Conducted via Telephone due to current COVID-19 concerns for limiting patient exposure  Name: Cace Osorto MRN: [id number]  Date: 04/21/2022  DOB: 1945/12/23  GN:[id number], Caren Macadam, MD   REFERRING PHYSICIAN: Jeryl Columbia, MD  DIAGNOSIS: 77 y.o. gentleman with oligorecurrent Stage T1c adenocarcinoma of the prostate with current PSA of 16, s/p brachytherapy in 01/2020 for Gleason 3+4 prostate cancer with a pretreatment PSA of 11.7.    ICD-10-CM   1. Malignant neoplasm of prostate  C61       HISTORY OF PRESENT ILLNESS: Shyheim Tanney is a 77 y.o. male with oligo-recurrent prostate cancer. He had a history of elevated PSA, followed by the [facility] and was initially diagnosed with Gleason 3+4 adenocarcinoma of the prostate on transrectal ultrasound with 11 biopsies of the prostate performed by Dr. Saddie Benders on 09/04/2019.  PSA at the time of diagnosis was 11.7.  Out of 11 core biopsies, 8 were positive.  The maximum Gleason score was 3+4, and this was seen in the right apex, and right base (small foci) as well as 5/6 of the left-sided cores with the exception of the left apex lateral. Additionally, Gleason 3+3 was seen in the right mid lateral. He underwent CT A/P on 09/19/2019 for disease staging and this was without evidence of visceral or skeletal metastatic disease.  We met with the patient in October 2021 to discuss treatment options and he ultimately elected to proceed with brachytherapy for treatment of his disease.  The brachytherapy procedure was performed on 01/16/2020 which he tolerated very well.  He had an initial excellent response with a PSA nadir at 2.4 in August 2022 but this was noted to be increased up to 16 in December 2023.  This was further evaluated with a PSMA PET scan performed on 02/09/2022 which showed bilateral seminal vesicle involvement and increased uptake in a  4 mm left external iliac node.  There was no activity in the prostate gland and were no other visceral or osseous metastases noted.  The patient reviewed the lab and imaging results with his urology team at the [facility] and was started on total androgen blockade with a 54-month Eligard injection and Zytiga/prednisone on 02/16/2022.  He has kindly been referred today for discussion of potential salvage radiation treatment options.   PREVIOUS RADIATION THERAPY: No  PAST MEDICAL HISTORY:  Past Medical History:  Diagnosis Date   Arthritis    Colon polyps    Coronary artery disease    s/p PTCA, DES to the LAD November 2009   DM type 2 (diabetes mellitus, type 2)    Dyspnea    with exertion climbing steps upward   History of chicken pox as child   Hyperlipidemia    OA (osteoarthritis)    Prostate cancer    PVD (peripheral vascular disease) with claudication    Tobacco abuse    Wears dentures    full top , partail bottom   Wears glasses    Wears hearing aid in both ears       PAST SURGICAL HISTORY: Past Surgical History:  Procedure Laterality Date   BACK SURGERY  2011   x 2 lower back   CARDIAC CATHETERIZATION  2009   2 DES stents to the LAD   PROSTATE BIOPSY  2021   RADIOACTIVE SEED IMPLANT N/A 01/16/2020   Procedure: RADIOACTIVE SEED IMPLANT/BRACHYTHERAPY IMPLANT;  Surgeon: Debroah Baller, MD;  Location: [facility];  Service: Urology;  Laterality: N/A;    FAMILY HISTORY:  Family History  Problem Relation Age of Onset   Heart attack Father    Breast cancer Neg Hx    Prostate cancer Neg Hx    Colon cancer Neg Hx    Pancreatic cancer Neg Hx     SOCIAL HISTORY:  Social History   Socioeconomic History   Marital status: Married    Spouse name: Not on file   Number of children: 2   Years of education: Not on file   Highest education level: Not on file  Occupational History   Occupation: Retired  Tobacco Use   Smoking status: Every Day    Packs/day: 0.50     Years: 50.00    Additional pack years: 0.00    Total pack years: 25.00    Types: Cigarettes   Smokeless tobacco: Never  Vaping Use   Vaping Use: Never used  Substance and Sexual Activity   Alcohol use: Yes    Alcohol/week: 1.0 standard drink of alcohol    Types: 1 Standard drinks or equivalent per week    Comment: rare   Drug use: No   Sexual activity: Not Currently    Comment: wife has alzeheimers  Other Topics Concern   Not on file  Social History Narrative   Not on file   Social Determinants of Health   Financial Resource Strain: Not on file  Food Insecurity: No Food Insecurity (04/21/2022)   Hunger Vital Sign    Worried About Running Out of Food in the Last Year: Never true    Ran Out of Food in the Last Year: Never true  Transportation Needs: No Transportation Needs (04/21/2022)   PRAPARE - Administrator, Civil Service (Medical): No    Lack of Transportation (Non-Medical): No  Physical Activity: Not on file  Stress: Not on file  Social Connections: Not on file  Intimate Partner Violence: Not At Risk (04/21/2022)   Humiliation, Afraid, Rape, and Kick questionnaire    Fear of Current or Ex-Partner: No    Emotionally Abused: No    Physically Abused: No    Sexually Abused: No    ALLERGIES: Patient has no known allergies.  MEDICATIONS:  Current Outpatient Medications  Medication Sig Dispense Refill   aspirin 81 MG tablet Take 81 mg by mouth daily.     atorvastatin (LIPITOR) 20 MG tablet Take 1 tablet (20 mg total) daily by mouth. 90 tablet 3   calcium citrate (CALCITRATE - DOSED IN MG ELEMENTAL CALCIUM) 950 (200 Ca) MG tablet Take 1 tablet by mouth daily.     clopidogrel (PLAVIX) 75 MG tablet Take 1 tablet (75 mg total) daily by mouth. 90 tablet 3   diclofenac sodium (VOLTAREN) 1 % GEL Apply as needed topically (PAIN).     empagliflozin (JARDIANCE) 10 MG TABS tablet Take 10 mg by mouth daily.     metoprolol tartrate (LOPRESSOR) 25 MG tablet Take 0.5  tablets (12.5 mg total) 2 (two) times daily by mouth. 90 tablet 3   Multiple Vitamin (MULTIVITAMIN ADULT PO) Take by mouth daily.     nitroGLYCERIN (NITROSTAT) 0.4 MG SL tablet Place 1 tablet (0.4 mg total) every 5 (five) minutes as needed under the tongue for chest pain (x 3 pillsdaily). 75 tablet 3   Omega-3 Fatty Acids (OMEGA 3 PO) Take 540 mg by mouth daily.     tamsulosin (FLOMAX) 0.4 MG CAPS capsule SMARTSIG:1  Capsule(s) By Mouth Every Evening     No current facility-administered medications for this encounter.    REVIEW OF SYSTEMS:  On review of systems, the patient reports that he is doing well overall. He denies any chest pain, shortness of breath, cough, fevers, chills, night sweats, unintended weight changes. He denies any bowel disturbances, and denies abdominal pain, nausea or vomiting. He denies any new musculoskeletal or joint aches or pains. His IPSS was 17, indicating moderate urinary symptoms. He reports occasional small volume urinary leakage, as well as urinary hesitancy, frequency, urgency, and nocturia x1-2. He denies dysuria, gross hematuria, straining to void or incomplete bladder emptying. His SHIM was 1, indicating he has severe erectile dysfunction. He notes, however, that he is not currently sexually active. A complete review of systems is obtained and is otherwise negative.    PHYSICAL EXAM:  Wt Readings from Last 3 Encounters:  04/21/22 180 lb (81.6 kg)  09/08/21 182 lb 9.6 oz (82.8 kg)  03/02/20 187 lb 9.6 oz (85.1 kg)   Temp Readings from Last 3 Encounters:  02/06/20 (!) 97.1 F (36.2 C) (Temporal)  01/16/20 97.7 F (36.5 C)   BP Readings from Last 3 Encounters:  09/08/21 108/70  03/02/20 102/72  02/06/20 96/63   Pulse Readings from Last 3 Encounters:  09/08/21 81  03/02/20 82  02/06/20 77   Pain Assessment Pain Score: 0-No pain/10  Physical exam not performed in light of telephone consult visit format.   KPS = 90  100 - Normal; no complaints;  no evidence of disease. 90   - Able to carry on normal activity; minor signs or symptoms of disease. 80   - Normal activity with effort; some signs or symptoms of disease. 33   - Cares for self; unable to carry on normal activity or to do active work. 60   - Requires occasional assistance, but is able to care for most of his personal needs. 50   - Requires considerable assistance and frequent medical care. 40   - Disabled; requires special care and assistance. 30   - Severely disabled; hospital admission is indicated although death not imminent. 20   - Very sick; hospital admission necessary; active supportive treatment necessary. 10   - Moribund; fatal processes progressing rapidly. 0     - Dead  Karnofsky DA, Abelmann WH, Craver LS and Burchenal [facility] [phone]) The use of the nitrogen mustards in the palliative treatment of carcinoma: with particular reference to bronchogenic carcinoma Cancer 1 [id number]  LABORATORY DATA:  Lab Results  Component Value Date   WBC 7.0 01/13/2020   HGB 17.1 (H) 01/13/2020   HCT 51.1 01/13/2020   MCV 94.3 01/13/2020   PLT 222 01/13/2020   Lab Results  Component Value Date   NA 140 01/13/2020   K 4.7 01/13/2020   CL 105 01/13/2020   CO2 23 01/13/2020   Lab Results  Component Value Date   ALT 15 01/13/2020   AST 20 01/13/2020   ALKPHOS 69 01/13/2020   BILITOT 0.8 01/13/2020     RADIOGRAPHY: No results found.    IMPRESSION/PLAN: This visit was conducted via Telephone to spare the patient unnecessary potential exposure in the healthcare setting during the current COVID-19 pandemic. 1. 77 y.o. gentleman oligorecurrent Stage T1c adenocarcinoma of the prostate with current PSA of 16, s/p brachytherapy in 01/2020 for Gleason 3+4 prostate cancer with a pretreatment PSA of 11.7. Today, we talked to the patient and family about the findings and workup  thus far. We discussed the natural history of oligo recurrent prostate cancer and general treatment, highlighting  the role of radiotherapy in the management. We discussed the available radiation techniques, and focused on the details and logistics of delivery.  The recommendation is for a 10 fraction course of stereotactic body radiotherapy (SBRT) to the bilateral seminal vesicles and involved left external iliac node and adjacent nodal echelon.  We reviewed the anticipated acute and late sequelae associated with radiation in this setting. The patient was encouraged to ask questions that were answered to his stated satisfaction.   At the conclusion of our conversation, the patient elects to proceed with the recommended 0 fraction course of stereotactic body radiotherapy (SBRT) to the bilateral seminal vesicles and involved left external iliac node and adjacent nodal echelon.  He is provided verbal consent to proceed today in the office and we have tentatively scheduled him for CT simulation/treatment planning at 10 AM on Monday, 04/25/2022, in anticipation of beginning his treatments in the near future.  He will sign formal written consent to proceed at the time of his CT simulation and a copy of this document will be placed in his medical record.  He appears to have a good understanding of his disease and our treatment recommendations which are of curative intent.  He is comfortable and in agreement with the stated plan so we will share our discussion with his treatment team at the [facility] and proceed with treatment planning accordingly. We enjoyed meeting with him again today and look forward to continuing to participate in his care.  Given current concerns for patient exposure during the COVID-19 pandemic, this encounter was conducted via telephone. The patient was notified in advance and was offered a MyChart meeting to allow for face to face communication but unfortunately reported that he did not have the appropriate resources/technology to support such a visit and instead preferred to proceed with telephone  consult. The patient has given verbal consent for this type of encounter. The attendants for this meeting include Margaretmary Dys MD, Ashlyn Bruning PA-C, and patient, Kiree Dejarnette and his family. During the encounter, Margaretmary Dys MD, Ashlyn Bruning PA-C, and scribe, Mickie Bail were located at [facility] Radiation Oncology Department.  Patient, Stratton Villwock and his family were located at home.   We personally spent 45 minutes in this encounter including chart review, reviewing radiological studies, telephone conversation with the patient, entering orders and completing documentation.    Marguarite Arbour, PA-C    Margaretmary Dys, MD  [facility]  Radiation Oncology Direct Dial: [phone]  Fax: [phone] [email or web address]  Skype  LinkedIn

## 2022-04-21 NOTE — Progress Notes (Signed)
Telephone nursing interview for a 77 yr old male w/ Malignant neoplasm of the prostate.  Patient identity verified x2.  Patient reports doing well. No discomfort conveyed at this time.  Meaningful use complete.  I-PSS score- 17- moderate SHIM score- 3 No sexual activity with the last 6 months.  Urology appt- None currently w/ Dr. Saddie Benders at [facility]  Ht 6' (1.829 m)   Wt 180 lb (81.6 kg)   BMI 24.41 kg/m   Patient contact # [phone]  This concludes the interview.   Ruel Favors, LPN

## 2022-04-25 ENCOUNTER — Ambulatory Visit
Admission: RE | Admit: 2022-04-25 | Discharge: 2022-04-25 | Disposition: A | Discharge status: Home / Self Care | Payer: No Typology Code available for payment source | Source: Ambulatory Visit | Attending: Radiation Oncology | Admitting: Radiation Oncology

## 2022-04-25 DIAGNOSIS — C61 Malignant neoplasm of prostate: Secondary | ICD-10-CM

## 2022-04-25 NOTE — Progress Notes (Signed)
  Radiation Oncology         [phone] ________________________________  Name: Adam Gilbert MRN: [id number]  Date: 04/25/2022  DOB: Mar 16, 1945  ULTRAHYPOFRACTIONATED RADIOTHERAPY  SIMULATION AND TREATMENT PLANNING NOTE    ICD-10-CM   1. Recurrent adenocarcinoma of prostate  C61       DIAGNOSIS:  77 y.o. gentleman with oligorecurrent Stage T1c adenocarcinoma of the prostate with current PSA of 16, s/p brachytherapy in 01/2020 for Gleason 3+4 prostate cancer with a pretreatment PSA of 11.7.  NARRATIVE:  The patient was brought to the CT Simulation planning suite.  Identity was confirmed.  All relevant records and images related to the planned course of therapy were reviewed.  The patient freely provided informed written consent to proceed with treatment after reviewing the details related to the planned course of therapy. The consent form was witnessed and verified by the simulation staff.  Then, the patient was set-up in a stable reproducible  supine position for radiation therapy.  A BodyFix immobilization pillow was fabricated for reproducible positioning.  Surface markings were placed.  The CT images were loaded into the planning software.  The gross target volumes (GTV) and planning target volumes (PTV) were delinieated, and avoidance structures were contoured.  Treatment planning then occurred.  The radiation prescription was entered and confirmed.  A total of two complex treatment devices were fabricated in the form of the BodyFix immobilization pillow and a neck accuform cushion.  I have requested : 3D Simulation  I have requested a DVH of the following structures: targets and all normal structures near the target including rectum, bladder, bowel, skin and others as noted on the radiation plan to maintain doses in adherence with established limits  SPECIAL TREATMENT PROCEDURE:  The planned course of therapy using radiation constitutes a special treatment procedure. Special care is required  in the management of this patient for the following reasons. High dose per fraction requiring special monitoring for increased toxicities of treatment including daily imaging..  The special nature of the planned course of radiotherapy will require increased physician supervision and oversight to ensure patient's safety with optimal treatment outcomes.    This requires extended time and effort.    PLAN:  The patient will receive 50 Gy in 10 fractions to the PET avid recurrence areas with 30 Gy in 10 fractions to the involved nodal echelon.  ________________________________  Artist Pais Kathrynn Running, M.D.

## 2022-04-29 DIAGNOSIS — C61 Malignant neoplasm of prostate: Secondary | ICD-10-CM | POA: Diagnosis not present

## 2022-05-04 ENCOUNTER — Ambulatory Visit
Admission: RE | Admit: 2022-05-04 | Discharge: 2022-05-04 | Disposition: A | Discharge status: Home / Self Care | Payer: No Typology Code available for payment source | Source: Ambulatory Visit | Attending: Radiation Oncology | Admitting: Radiation Oncology

## 2022-05-04 ENCOUNTER — Other Ambulatory Visit: Payer: Self-pay

## 2022-05-04 DIAGNOSIS — C61 Malignant neoplasm of prostate: Secondary | ICD-10-CM | POA: Diagnosis present

## 2022-05-04 LAB — RAD ONC ARIA SESSION SUMMARY
Course Elapsed Days: 0
Plan Fractions Treated to Date: 1
Plan Fractions Treated to Date: 1
Plan Prescribed Dose Per Fraction: 5 Gy
Plan Prescribed Dose Per Fraction: 5 Gy
Plan Total Fractions Prescribed: 10
Plan Total Fractions Prescribed: 10
Plan Total Prescribed Dose: 50 Gy
Plan Total Prescribed Dose: 50 Gy
Reference Point Dosage Given to Date: 5 Gy
Reference Point Dosage Given to Date: 5 Gy
Reference Point Session Dosage Given: 5 Gy
Reference Point Session Dosage Given: 5 Gy
Session Number: 1

## 2022-05-05 ENCOUNTER — Other Ambulatory Visit: Payer: Self-pay

## 2022-05-05 ENCOUNTER — Ambulatory Visit
Admission: RE | Admit: 2022-05-05 | Discharge: 2022-05-05 | Disposition: A | Discharge status: Home / Self Care | Payer: No Typology Code available for payment source | Source: Ambulatory Visit | Attending: Radiation Oncology | Admitting: Radiation Oncology

## 2022-05-05 ENCOUNTER — Ambulatory Visit: Payer: No Typology Code available for payment source

## 2022-05-05 DIAGNOSIS — C61 Malignant neoplasm of prostate: Secondary | ICD-10-CM | POA: Diagnosis not present

## 2022-05-05 LAB — RAD ONC ARIA SESSION SUMMARY
Course Elapsed Days: 1
Plan Fractions Treated to Date: 2
Plan Fractions Treated to Date: 2
Plan Prescribed Dose Per Fraction: 5 Gy
Plan Prescribed Dose Per Fraction: 5 Gy
Plan Total Fractions Prescribed: 10
Plan Total Fractions Prescribed: 10
Plan Total Prescribed Dose: 50 Gy
Plan Total Prescribed Dose: 50 Gy
Reference Point Dosage Given to Date: 10 Gy
Reference Point Dosage Given to Date: 10 Gy
Reference Point Session Dosage Given: 5 Gy
Reference Point Session Dosage Given: 5 Gy
Session Number: 2

## 2022-05-06 ENCOUNTER — Ambulatory Visit
Admission: RE | Admit: 2022-05-06 | Discharge: 2022-05-06 | Disposition: A | Discharge status: Home / Self Care | Payer: No Typology Code available for payment source | Source: Ambulatory Visit | Attending: Radiation Oncology | Admitting: Radiation Oncology

## 2022-05-06 ENCOUNTER — Other Ambulatory Visit: Payer: Self-pay

## 2022-05-06 DIAGNOSIS — C61 Malignant neoplasm of prostate: Secondary | ICD-10-CM | POA: Insufficient documentation

## 2022-05-06 LAB — RAD ONC ARIA SESSION SUMMARY
Course Elapsed Days: 2
Plan Fractions Treated to Date: 3
Plan Fractions Treated to Date: 3
Plan Prescribed Dose Per Fraction: 5 Gy
Plan Prescribed Dose Per Fraction: 5 Gy
Plan Total Fractions Prescribed: 10
Plan Total Fractions Prescribed: 10
Plan Total Prescribed Dose: 50 Gy
Plan Total Prescribed Dose: 50 Gy
Reference Point Dosage Given to Date: 15 Gy
Reference Point Dosage Given to Date: 15 Gy
Reference Point Session Dosage Given: 5 Gy
Reference Point Session Dosage Given: 5 Gy
Session Number: 3

## 2022-05-06 MED ORDER — SONAFINE EX EMUL
1.0000 | Freq: Two times a day (BID) | CUTANEOUS | Status: DC
Start: 2022-05-06 — End: 2022-05-07
  Administered 2022-05-06: 1 via TOPICAL

## 2022-05-06 MED ORDER — SILVER SULFADIAZINE 1 % EX CREA: TOPICAL_CREAM | Freq: Two times a day (BID) | CUTANEOUS | Status: DC

## 2022-05-06 NOTE — Progress Notes (Signed)
Pt here for patient teaching.  Pt given Radiation and You booklet, skin care instructions, and Sonafine. Reviewed areas of pertinence such as diarrhea, fatigue, hair loss, nausea and vomiting, sexual and fertility changes, skin changes, urinary and bladder changes, and taste changes. Pt able to give teach back of to pat skin, use unscented/gentle soap, use baby wipes, have Imodium on hand, drink plenty of water, and sitz bath, apply Sonafine bid, avoid applying anything to skin within 4 hours of treatment, and to use an electric razor if they must shave. Pt verbalizes understanding of information given and will contact nursing with any questions or concerns.     Http://rtanswers.org/treatmentinformation/whattoexpect/index  This concludes the interview.   Ruel Favors, LPN

## 2022-05-09 ENCOUNTER — Other Ambulatory Visit: Payer: Self-pay

## 2022-05-09 ENCOUNTER — Ambulatory Visit
Admission: RE | Admit: 2022-05-09 | Discharge: 2022-05-09 | Disposition: A | Discharge status: Home / Self Care | Payer: No Typology Code available for payment source | Source: Ambulatory Visit | Attending: Radiation Oncology | Admitting: Radiation Oncology

## 2022-05-09 DIAGNOSIS — C61 Malignant neoplasm of prostate: Secondary | ICD-10-CM | POA: Diagnosis not present

## 2022-05-09 LAB — RAD ONC ARIA SESSION SUMMARY
Course Elapsed Days: 5
Plan Fractions Treated to Date: 4
Plan Fractions Treated to Date: 4
Plan Prescribed Dose Per Fraction: 5 Gy
Plan Prescribed Dose Per Fraction: 5 Gy
Plan Total Fractions Prescribed: 10
Plan Total Fractions Prescribed: 10
Plan Total Prescribed Dose: 50 Gy
Plan Total Prescribed Dose: 50 Gy
Reference Point Dosage Given to Date: 20 Gy
Reference Point Dosage Given to Date: 20 Gy
Reference Point Session Dosage Given: 5 Gy
Reference Point Session Dosage Given: 5 Gy
Session Number: 4

## 2022-05-10 ENCOUNTER — Other Ambulatory Visit: Payer: Self-pay

## 2022-05-10 ENCOUNTER — Ambulatory Visit
Admission: RE | Admit: 2022-05-10 | Discharge: 2022-05-10 | Disposition: A | Discharge status: Home / Self Care | Payer: No Typology Code available for payment source | Source: Ambulatory Visit | Attending: Radiation Oncology | Admitting: Radiation Oncology

## 2022-05-10 DIAGNOSIS — C61 Malignant neoplasm of prostate: Secondary | ICD-10-CM | POA: Diagnosis not present

## 2022-05-10 LAB — RAD ONC ARIA SESSION SUMMARY
Course Elapsed Days: 6
Plan Fractions Treated to Date: 5
Plan Fractions Treated to Date: 5
Plan Prescribed Dose Per Fraction: 5 Gy
Plan Prescribed Dose Per Fraction: 5 Gy
Plan Total Fractions Prescribed: 10
Plan Total Fractions Prescribed: 10
Plan Total Prescribed Dose: 50 Gy
Plan Total Prescribed Dose: 50 Gy
Reference Point Dosage Given to Date: 25 Gy
Reference Point Dosage Given to Date: 25 Gy
Reference Point Session Dosage Given: 5 Gy
Reference Point Session Dosage Given: 5 Gy
Session Number: 5

## 2022-05-11 ENCOUNTER — Ambulatory Visit
Admission: RE | Admit: 2022-05-11 | Discharge: 2022-05-11 | Discharge status: Home / Self Care | Payer: No Typology Code available for payment source | Source: Ambulatory Visit | Attending: Radiation Oncology

## 2022-05-11 ENCOUNTER — Other Ambulatory Visit: Payer: Self-pay

## 2022-05-11 DIAGNOSIS — C61 Malignant neoplasm of prostate: Secondary | ICD-10-CM | POA: Diagnosis not present

## 2022-05-11 LAB — RAD ONC ARIA SESSION SUMMARY
Course Elapsed Days: 7
Plan Fractions Treated to Date: 6
Plan Fractions Treated to Date: 6
Plan Prescribed Dose Per Fraction: 5 Gy
Plan Prescribed Dose Per Fraction: 5 Gy
Plan Total Fractions Prescribed: 10
Plan Total Fractions Prescribed: 10
Plan Total Prescribed Dose: 50 Gy
Plan Total Prescribed Dose: 50 Gy
Reference Point Dosage Given to Date: 30 Gy
Reference Point Dosage Given to Date: 30 Gy
Reference Point Session Dosage Given: 5 Gy
Reference Point Session Dosage Given: 5 Gy
Session Number: 6

## 2022-05-12 ENCOUNTER — Ambulatory Visit
Admission: RE | Admit: 2022-05-12 | Discharge: 2022-05-12 | Disposition: A | Discharge status: Home / Self Care | Payer: No Typology Code available for payment source | Source: Ambulatory Visit | Attending: Radiation Oncology | Admitting: Radiation Oncology

## 2022-05-12 ENCOUNTER — Other Ambulatory Visit: Payer: Self-pay

## 2022-05-12 DIAGNOSIS — C61 Malignant neoplasm of prostate: Secondary | ICD-10-CM | POA: Diagnosis not present

## 2022-05-12 LAB — RAD ONC ARIA SESSION SUMMARY
Course Elapsed Days: 8
Plan Fractions Treated to Date: 7
Plan Fractions Treated to Date: 7
Plan Prescribed Dose Per Fraction: 5 Gy
Plan Prescribed Dose Per Fraction: 5 Gy
Plan Total Fractions Prescribed: 10
Plan Total Fractions Prescribed: 10
Plan Total Prescribed Dose: 50 Gy
Plan Total Prescribed Dose: 50 Gy
Reference Point Dosage Given to Date: 35 Gy
Reference Point Dosage Given to Date: 35 Gy
Reference Point Session Dosage Given: 5 Gy
Reference Point Session Dosage Given: 5 Gy
Session Number: 7

## 2022-05-13 ENCOUNTER — Other Ambulatory Visit: Payer: Self-pay

## 2022-05-13 ENCOUNTER — Ambulatory Visit
Admission: RE | Admit: 2022-05-13 | Discharge: 2022-05-13 | Disposition: A | Discharge status: Home / Self Care | Payer: No Typology Code available for payment source | Source: Ambulatory Visit | Attending: Radiation Oncology

## 2022-05-13 DIAGNOSIS — C61 Malignant neoplasm of prostate: Secondary | ICD-10-CM | POA: Diagnosis not present

## 2022-05-13 LAB — RAD ONC ARIA SESSION SUMMARY
Course Elapsed Days: 9
Plan Fractions Treated to Date: 8
Plan Fractions Treated to Date: 8
Plan Prescribed Dose Per Fraction: 5 Gy
Plan Prescribed Dose Per Fraction: 5 Gy
Plan Total Fractions Prescribed: 10
Plan Total Fractions Prescribed: 10
Plan Total Prescribed Dose: 50 Gy
Plan Total Prescribed Dose: 50 Gy
Reference Point Dosage Given to Date: 40 Gy
Reference Point Dosage Given to Date: 40 Gy
Reference Point Session Dosage Given: 5 Gy
Reference Point Session Dosage Given: 5 Gy
Session Number: 8

## 2022-05-16 ENCOUNTER — Ambulatory Visit
Admission: RE | Admit: 2022-05-16 | Discharge: 2022-05-16 | Disposition: A | Discharge status: Home / Self Care | Payer: No Typology Code available for payment source | Source: Ambulatory Visit | Attending: Radiation Oncology | Admitting: Radiation Oncology

## 2022-05-16 ENCOUNTER — Other Ambulatory Visit: Payer: Self-pay

## 2022-05-16 DIAGNOSIS — C61 Malignant neoplasm of prostate: Secondary | ICD-10-CM | POA: Diagnosis not present

## 2022-05-16 LAB — RAD ONC ARIA SESSION SUMMARY
Course Elapsed Days: 12
Plan Fractions Treated to Date: 9
Plan Fractions Treated to Date: 9
Plan Prescribed Dose Per Fraction: 5 Gy
Plan Prescribed Dose Per Fraction: 5 Gy
Plan Total Fractions Prescribed: 10
Plan Total Fractions Prescribed: 10
Plan Total Prescribed Dose: 50 Gy
Plan Total Prescribed Dose: 50 Gy
Reference Point Dosage Given to Date: 45 Gy
Reference Point Dosage Given to Date: 45 Gy
Reference Point Session Dosage Given: 5 Gy
Reference Point Session Dosage Given: 5 Gy
Session Number: 9

## 2022-05-17 ENCOUNTER — Ambulatory Visit
Admission: RE | Admit: 2022-05-17 | Discharge: 2022-05-17 | Disposition: A | Discharge status: Home / Self Care | Payer: No Typology Code available for payment source | Source: Ambulatory Visit | Attending: Radiation Oncology | Admitting: Radiation Oncology

## 2022-05-17 ENCOUNTER — Other Ambulatory Visit: Payer: Self-pay

## 2022-05-17 DIAGNOSIS — C61 Malignant neoplasm of prostate: Secondary | ICD-10-CM | POA: Diagnosis not present

## 2022-05-17 LAB — RAD ONC ARIA SESSION SUMMARY
Course Elapsed Days: 13
Plan Fractions Treated to Date: 10
Plan Fractions Treated to Date: 10
Plan Prescribed Dose Per Fraction: 5 Gy
Plan Prescribed Dose Per Fraction: 5 Gy
Plan Total Fractions Prescribed: 10
Plan Total Fractions Prescribed: 10
Plan Total Prescribed Dose: 50 Gy
Plan Total Prescribed Dose: 50 Gy
Reference Point Dosage Given to Date: 50 Gy
Reference Point Dosage Given to Date: 50 Gy
Reference Point Session Dosage Given: 5 Gy
Reference Point Session Dosage Given: 5 Gy
Session Number: 10

## 2022-05-20 NOTE — Radiation Completion Notes (Addendum)
  Radiation Oncology         [phone] ________________________________  Name: Adam Gilbert MRN: [id number]  Date: 05/17/2022  DOB: 1945/05/01  Referring Physician: Jeryl Columbia, M.D. Date of Service: 2022-05-20 Radiation Oncologist: Margaretmary Bayley, M.D. [facility]     RADIATION ONCOLOGY END OF TREATMENT NOTE     Diagnosis: 77 y.o. gentleman with oligorecurrent Stage T1c adenocarcinoma of the prostate with current PSA of 16, s/p brachytherapy in 01/2020 for Gleason 3+4 prostate cancer with a pretreatment PSA of 11.7.   Intent: Curative     ==========DELIVERED PLANS==========  First Treatment Date: 2022-05-04 - Last Treatment Date: 2022-05-17   Plan Name: Pelvis Site: Pelvis Technique: IMRT Mode: Photon Dose Per Fraction: 5 Gy Prescribed Dose (Delivered / Prescribed): 50 Gy / 50 Gy Prescribed Fxs (Delivered / Prescribed): 10 / 10   Plan Name: Pelvis_SV Site: Seminal Vesicles Technique: IMRT Mode: Photon Dose Per Fraction: 5 Gy Prescribed Dose (Delivered / Prescribed): 50 Gy / 50 Gy Prescribed Fxs (Delivered / Prescribed): 10 / 10     ==========ON TREATMENT VISIT DATES========== 2022-05-06, 2022-05-12   See weekly On Treatment Notes is Epic for details. He tolerated the treatments well with only modest fatigue.  The patient will receive a call in about one month from the radiation oncology department. He will continue follow up with his urology team at the [facility] as well.  ------------------------------------------------   Margaretmary Dys, MD [facility]  Radiation Oncology Direct Dial: [phone]  Fax: [phone] [email or web address]  Skype  LinkedIn

## 2023-01-20 ENCOUNTER — Telehealth: Payer: Self-pay | Admitting: Gastroenterology

## 2023-01-20 NOTE — Telephone Encounter (Signed)
Good morning Dr. Chales Abrahams,   Patients daughter called stating that she wanted to see if patient needed to come back for a colonoscopy . I advised her that there was a recall for 2020. He is not having any GI issues, but she stated that he has had prostate cancer since the last time he was seen and he is 40. Does patient need a office visit or can he go ahead and schedule procedure?   Please advise, thank you!

## 2023-01-31 NOTE — Telephone Encounter (Signed)
Does not need repeat routine colonoscopy d/t age  Only if he starts having any new lower GI problems RG

## 2023-01-31 NOTE — Telephone Encounter (Signed)
Patients daughter called to follow up on message below.

## 2023-02-20 NOTE — Telephone Encounter (Signed)
Patient daughter called and stated that her father does not have any new GI symptoms and she will call if anything changes.

## 2023-03-24 ENCOUNTER — Encounter: Payer: Self-pay | Admitting: Cardiovascular Disease

## 2023-03-24 ENCOUNTER — Ambulatory Visit: Payer: Self-pay | Attending: Cardiovascular Disease | Admitting: Cardiovascular Disease

## 2023-03-24 VITALS — BP 112/80 | HR 67 | Ht 72.0 in | Wt 194.8 lb

## 2023-03-24 DIAGNOSIS — E7800 Pure hypercholesterolemia, unspecified: Secondary | ICD-10-CM

## 2023-03-24 DIAGNOSIS — I2510 Atherosclerotic heart disease of native coronary artery without angina pectoris: Secondary | ICD-10-CM

## 2023-03-24 NOTE — Addendum Note (Signed)
 Addended by: Lendon Ka on: 03/24/2023 08:44 AM   Modules accepted: Orders

## 2023-03-24 NOTE — Patient Instructions (Signed)
 Medication Instructions:  Stop aspirin *If you need a refill on your cardiac medications before your next appointment, please call your pharmacy*   Lab Work: none If you have labs (blood work) drawn today and your tests are completely normal, you will receive your results only by: MyChart Message (if you have MyChart) OR A paper copy in the mail If you have any lab test that is abnormal or we need to change your treatment, we will call you to review the results.   Testing/Procedures: none   Follow-Up: At [facility], you and your health needs are our priority.  As part of our continuing mission to provide you with exceptional heart care, we have created designated Provider Care Teams.  These Care Teams include your primary Cardiologist (physician) and Advanced Practice Providers (APPs -  Physician Assistants and Nurse Practitioners) who all work together to provide you with the care you need, when you need it.  We recommend signing up for the patient portal called "MyChart".  Sign up information is provided on this After Visit Summary.  MyChart is used to connect with patients for Virtual Visits (Telemedicine).  Patients are able to view lab/test results, encounter notes, upcoming appointments, etc.  Non-urgent messages can be sent to your provider as well.   To learn more about what you can do with MyChart, go to [email or web address].    Your next appointment:   12 month(s)  Provider:   Verne Carrow, MD     Other Instructions

## 2023-03-24 NOTE — Progress Notes (Signed)
 Chief Complaint  Patient presents with   Follow-up    CAD   History of Present Illness: 78 yo male with history of CAD, hyperlipidemia, tobacco abuse, OA, DM, prostate cancer and PAD who is here today for cardiac follow up. He had a NSTEMI November 2009 and had a bare metal stent placed in the mid LAD. The diagonal branch was jailed by the stent. PTCA was performed in the diagonal branch. The Circumflex and the RCA were free of obstructive CAD. Echo November 2014 with normal LV function. No valve disease. Stress test November 2018 with no ischemic EKG changes.    He is here today for follow up. The patient denies any chest pain, dyspnea, palpitations, lower extremity edema, orthopnea, PND, dizziness, near syncope or syncope.    Primary Care Physician: [facility]  Past Medical History:  Diagnosis Date   Arthritis    Colon polyps    Coronary artery disease    s/p PTCA, DES to the LAD November 2009   DM type 2 (diabetes mellitus, type 2) (HCC)    Dyspnea    with exertion climbing steps upward   History of chicken pox as child   Hyperlipidemia    OA (osteoarthritis)    Prostate cancer (HCC)    PVD (peripheral vascular disease) with claudication (HCC)    Tobacco abuse    Wears dentures    full top , partail bottom   Wears glasses    Wears hearing aid in both ears     Past Surgical History:  Procedure Laterality Date   BACK SURGERY  2011   x 2 lower back   CARDIAC CATHETERIZATION  2009   2 DES stents to the LAD   PROSTATE BIOPSY  2021   RADIOACTIVE SEED IMPLANT N/A 01/16/2020   Procedure: RADIOACTIVE SEED IMPLANT/BRACHYTHERAPY IMPLANT;  Surgeon: Debroah Baller, MD;  Location: [facility];  Service: Urology;  Laterality: N/A;    Current Outpatient Medications  Medication Sig Dispense Refill   aspirin 81 MG tablet Take 81 mg by mouth daily.     atorvastatin (LIPITOR) 20 MG tablet Take 1 tablet (20 mg total) daily by mouth. 90 tablet 3    buPROPion (WELLBUTRIN XL) 300 MG 24 hr tablet Take 300 mg by mouth daily.     calcium citrate (CALCITRATE - DOSED IN MG ELEMENTAL CALCIUM) 950 (200 Ca) MG tablet Take 1 tablet by mouth daily.     clopidogrel (PLAVIX) 75 MG tablet Take 1 tablet (75 mg total) daily by mouth. 90 tablet 3   diclofenac sodium (VOLTAREN) 1 % GEL Apply as needed topically (PAIN).     empagliflozin (JARDIANCE) 10 MG TABS tablet Take 10 mg by mouth daily.     fluorouracil (EFUDEX) 5 % cream Apply topically.     metoprolol tartrate (LOPRESSOR) 25 MG tablet Take 0.5 tablets (12.5 mg total) 2 (two) times daily by mouth. 90 tablet 3   Multiple Vitamin (MULTIVITAMIN ADULT PO) Take by mouth daily.     nicotine polacrilex (NICORETTE) 2 MG gum Take by mouth.     nitroGLYCERIN (NITROSTAT) 0.4 MG SL tablet Place 1 tablet (0.4 mg total) every 5 (five) minutes as needed under the tongue for chest pain (x 3 pillsdaily). 75 tablet 3   Omega-3 Fatty Acids (OMEGA 3 PO) Take 540 mg by mouth daily.     tamsulosin (FLOMAX) 0.4 MG CAPS capsule SMARTSIG:1 Capsule(s) By Mouth Every Evening     No current  facility-administered medications for this visit.    No Known Allergies  Social History   Socioeconomic History   Marital status: Married    Spouse name: Not on file   Number of children: 2   Years of education: Not on file   Highest education level: Not on file  Occupational History   Occupation: Retired  Tobacco Use   Smoking status: Every Day    Current packs/day: 0.50    Average packs/day: 0.5 packs/day for 50.0 years (25.0 ttl pk-yrs)    Types: Cigarettes   Smokeless tobacco: Never  Vaping Use   Vaping status: Never Used  Substance and Sexual Activity   Alcohol use: Yes    Alcohol/week: 1.0 standard drink of alcohol    Types: 1 Standard drinks or equivalent per week    Comment: rare   Drug use: No   Sexual activity: Not Currently    Comment: wife has alzeheimers  Other Topics Concern   Not on file  Social History  Narrative   Not on file   Social Drivers of Health   Financial Resource Strain: Not on file  Food Insecurity: No Food Insecurity (04/21/2022)   Hunger Vital Sign    Worried About Running Out of Food in the Last Year: Never true    Ran Out of Food in the Last Year: Never true  Transportation Needs: No Transportation Needs (04/21/2022)   PRAPARE - Administrator, Civil Service (Medical): No    Lack of Transportation (Non-Medical): No  Physical Activity: Not on file  Stress: Not on file  Social Connections: Unknown (05/18/2021)   [facility], [facility]   Social Network    Social Network: Not on file  Intimate Partner Violence: Not At Risk (04/21/2022)   Humiliation, Afraid, Rape, and Kick questionnaire    Fear of Current or Ex-Partner: No    Emotionally Abused: No    Physically Abused: No    Sexually Abused: No    Family History  Problem Relation Age of Onset   Heart attack Father    Breast cancer Neg Hx    Prostate cancer Neg Hx    Colon cancer Neg Hx    Pancreatic cancer Neg Hx     Review of Systems:  As stated in the HPI and otherwise negative.   BP 112/80   Pulse 67   Ht 6' (1.829 m)   Wt 88.4 kg   SpO2 98%   BMI 26.42 kg/m   Physical Examination: General: Well developed, well nourished, NAD  HEENT: OP clear, mucus membranes moist  SKIN: warm, dry. No rashes. Neuro: No focal deficits  Musculoskeletal: Muscle strength 5/5 all ext  Psychiatric: Mood and affect normal  Neck: No JVD, no carotid bruits, no thyromegaly, no lymphadenopathy.  Lungs:Clear bilaterally, no wheezes, rhonci, crackles Cardiovascular: Regular rate and rhythm. No murmurs, gallops or rubs. Abdomen:Soft. Bowel sounds present. Non-tender.  Extremities: No lower extremity edema. Pulses are 2 + in the bilateral DP/PT.  EKG:  EKG is ordered today. The ekg ordered today demonstrates  EKG Interpretation Date/Time:  Friday March 24 2023 08:23:15 EDT Ventricular Rate:   65 PR Interval:  172 QRS Duration:  104 QT Interval:  420 QTC Calculation: 436 R Axis:   98  Text Interpretation: Normal sinus rhythm Incomplete right bundle branch block Confirmed by Verne Carrow [id number]) on 03/24/2023 8:29:32 AM    Recent Labs: No results found for requested labs within last 365 days.  Wt Readings from Last 3 Encounters:  03/24/23 88.4 kg  04/21/22 81.6 kg  09/08/21 82.8 kg    Assessment and Plan:   1. CAD without angina: No chest pain. Will stop ASA. Continue Plavix, statin and Lopressor.    2. HLD: Lipids are followed in primary care in the [place]. Lipids controlled per pt. I cannot see these results. Continue statin  3. Tobacco abuse: I have advised him to stop smoking. He is trying.   Labs/ tests ordered today include:   Orders Placed This Encounter  Procedures   EKG 12-Lead   Disposition:   F/U with me in 12  months  Signed, Verne Carrow, MD 03/24/2023 8:41 AM    [facility] [address] [place], [place], [place]  [address] Phone: [phone]; Fax: [phone]

## 2024-05-09 ENCOUNTER — Ambulatory Visit: Admitting: Cardiovascular Disease
